# Patient Record
Sex: Female | Born: 1990 | Race: Black or African American | Hispanic: No | Smoking: Former smoker
Health system: Southern US, Community
[De-identification: ages and names within clinical notes are randomized; demographics above are authoritative.]

## PROBLEM LIST (undated history)

## (undated) ENCOUNTER — Emergency Department (HOSPITAL_COMMUNITY): Admission: EM | Payer: Self-pay | Source: Home / Self Care

## (undated) DIAGNOSIS — Z9109 Other allergy status, other than to drugs and biological substances: Secondary | ICD-10-CM

## (undated) DIAGNOSIS — A749 Chlamydial infection, unspecified: Secondary | ICD-10-CM

## (undated) DIAGNOSIS — Z789 Other specified health status: Secondary | ICD-10-CM

## (undated) HISTORY — PX: OTHER SURGICAL HISTORY: SHX169

## (undated) HISTORY — DX: Other specified health status: Z78.9

---
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
## 2013-03-22 ENCOUNTER — Encounter: Payer: Self-pay | Admitting: *Deleted

## 2013-03-23 ENCOUNTER — Ambulatory Visit: Payer: Self-pay

## 2013-03-23 ENCOUNTER — Ambulatory Visit (INDEPENDENT_AMBULATORY_CARE_PROVIDER_SITE_OTHER): Payer: Medicaid Other | Admitting: Obstetrics & Gynecology

## 2013-03-23 ENCOUNTER — Encounter: Payer: Self-pay | Admitting: Obstetrics & Gynecology

## 2013-03-23 VITALS — BP 120/70 | Ht 66.0 in | Wt 228.0 lb

## 2013-03-23 DIAGNOSIS — Z3202 Encounter for pregnancy test, result negative: Secondary | ICD-10-CM

## 2013-03-23 DIAGNOSIS — Z3049 Encounter for surveillance of other contraceptives: Secondary | ICD-10-CM

## 2013-03-23 DIAGNOSIS — Z309 Encounter for contraceptive management, unspecified: Secondary | ICD-10-CM

## 2013-03-23 DIAGNOSIS — Z3200 Encounter for pregnancy test, result unknown: Secondary | ICD-10-CM

## 2013-03-23 LAB — POCT URINE PREGNANCY: Preg Test, Ur: NEGATIVE

## 2013-03-23 MED ORDER — MEDROXYPROGESTERONE ACETATE 150 MG/ML IM SUSP
150.0000 mg | Freq: Once | INTRAMUSCULAR | Status: AC
  Administered 2013-03-23: 150 mg via INTRAMUSCULAR

## 2013-03-26 ENCOUNTER — Ambulatory Visit: Payer: Self-pay

## 2013-06-15 ENCOUNTER — Encounter: Payer: Self-pay | Admitting: Adult Health

## 2013-06-15 ENCOUNTER — Ambulatory Visit (INDEPENDENT_AMBULATORY_CARE_PROVIDER_SITE_OTHER): Payer: Medicaid Other | Admitting: Adult Health

## 2013-06-15 VITALS — BP 120/78 | Ht 66.0 in | Wt 231.0 lb

## 2013-06-15 DIAGNOSIS — Z3202 Encounter for pregnancy test, result negative: Secondary | ICD-10-CM

## 2013-06-15 DIAGNOSIS — Z3049 Encounter for surveillance of other contraceptives: Secondary | ICD-10-CM

## 2013-06-15 LAB — POCT URINE PREGNANCY: Preg Test, Ur: NEGATIVE

## 2013-06-15 MED ORDER — MEDROXYPROGESTERONE ACETATE 150 MG/ML IM SUSP
150.0000 mg | Freq: Once | INTRAMUSCULAR | Status: AC
  Administered 2013-06-15: 150 mg via INTRAMUSCULAR

## 2013-06-15 NOTE — Progress Notes (Signed)
Pt here for Depo Provera injection. No problems at this time. Pt to return in 12 weeks for next injection.

## 2013-09-07 ENCOUNTER — Ambulatory Visit (INDEPENDENT_AMBULATORY_CARE_PROVIDER_SITE_OTHER): Payer: Medicaid Other | Admitting: Obstetrics & Gynecology

## 2013-09-07 ENCOUNTER — Telehealth: Payer: Self-pay | Admitting: *Deleted

## 2013-09-07 ENCOUNTER — Encounter (INDEPENDENT_AMBULATORY_CARE_PROVIDER_SITE_OTHER): Payer: Self-pay

## 2013-09-07 ENCOUNTER — Encounter: Payer: Self-pay | Admitting: Obstetrics & Gynecology

## 2013-09-07 VITALS — BP 120/80 | Ht 66.0 in | Wt 226.0 lb

## 2013-09-07 DIAGNOSIS — Z3049 Encounter for surveillance of other contraceptives: Secondary | ICD-10-CM

## 2013-09-07 DIAGNOSIS — Z3202 Encounter for pregnancy test, result negative: Secondary | ICD-10-CM

## 2013-09-07 DIAGNOSIS — Z1389 Encounter for screening for other disorder: Secondary | ICD-10-CM

## 2013-09-07 DIAGNOSIS — R300 Dysuria: Secondary | ICD-10-CM

## 2013-09-07 DIAGNOSIS — Z309 Encounter for contraceptive management, unspecified: Secondary | ICD-10-CM

## 2013-09-07 DIAGNOSIS — Z3200 Encounter for pregnancy test, result unknown: Secondary | ICD-10-CM

## 2013-09-07 LAB — POCT URINALYSIS DIPSTICK
Blood, UA: 3
Ketones, UA: NEGATIVE

## 2013-09-07 LAB — POCT URINE PREGNANCY: Preg Test, Ur: NEGATIVE

## 2013-09-07 MED ORDER — NITROFURANTOIN MONOHYD MACRO 100 MG PO CAPS: 100.0000 mg | ORAL_CAPSULE | Freq: Two times a day (BID) | ORAL | Status: DC

## 2013-09-07 MED ORDER — MEDROXYPROGESTERONE ACETATE 150 MG/ML IM SUSP
150.0000 mg | Freq: Once | INTRAMUSCULAR | Status: AC
  Administered 2013-09-07: 150 mg via INTRAMUSCULAR

## 2013-09-07 NOTE — Addendum Note (Signed)
Addended by: Richardson Chiquito on: 09/07/2013 09:40 AM   Modules accepted: Orders

## 2013-09-07 NOTE — Progress Notes (Signed)
Patient ID: Tammy [name], female   DOB: [birth year], 22 y.o.   MRN: [id number] Pt here today for DEPO injection, Pt stated that she was having really bad burning with urination and pain and pressure after voiding. Pt stated that the burning started the day before yesterday. Pt gave urine specimen  and it was sent to lab. Will route message to Dr. Despina Hidden as well.

## 2013-09-07 NOTE — Telephone Encounter (Signed)
Pt came in for DEPO injection today and stated that she has been burning really bad with urination for the past two days. Pt also stated that she was having some pain and pressure after voiding. Pt gave a specimen and it was sent to lab for a UA C&S.

## 2013-09-07 NOTE — Addendum Note (Signed)
Addended by: Richardson Chiquito on: 09/07/2013 09:43 AM   Modules accepted: Orders

## 2013-09-08 LAB — URINALYSIS
Glucose, UA: NEGATIVE mg/dL
Nitrite: POSITIVE — AB
Protein, ur: NEGATIVE mg/dL
Urobilinogen, UA: 0.2 mg/dL (ref 0.0–1.0)

## 2013-09-10 LAB — URINE CULTURE

## 2013-11-02 ENCOUNTER — Encounter (HOSPITAL_COMMUNITY): Payer: Self-pay | Admitting: Emergency Medicine

## 2013-11-02 ENCOUNTER — Emergency Department (HOSPITAL_COMMUNITY)
Admission: EM | Admit: 2013-11-02 | Discharge: 2013-11-02 | Disposition: A | Discharge status: Home / Self Care | Payer: Medicaid Other | Attending: Emergency Medicine | Admitting: Emergency Medicine

## 2013-11-02 ENCOUNTER — Emergency Department (HOSPITAL_COMMUNITY): Payer: Medicaid Other

## 2013-11-02 DIAGNOSIS — Z880 Allergy status to penicillin: Secondary | ICD-10-CM | POA: Insufficient documentation

## 2013-11-02 DIAGNOSIS — Z79899 Other long term (current) drug therapy: Secondary | ICD-10-CM | POA: Insufficient documentation

## 2013-11-02 DIAGNOSIS — F17200 Nicotine dependence, unspecified, uncomplicated: Secondary | ICD-10-CM | POA: Insufficient documentation

## 2013-11-02 DIAGNOSIS — Z9109 Other allergy status, other than to drugs and biological substances: Secondary | ICD-10-CM | POA: Insufficient documentation

## 2013-11-02 DIAGNOSIS — M722 Plantar fascial fibromatosis: Secondary | ICD-10-CM

## 2013-11-02 DIAGNOSIS — R000 Tachycardia, unspecified: Secondary | ICD-10-CM | POA: Insufficient documentation

## 2013-11-02 HISTORY — DX: Other allergy status, other than to drugs and biological substances: Z91.09

## 2013-11-02 MED ORDER — HYDROCODONE-ACETAMINOPHEN 7.5-325 MG PO TABS: 1.0000 | ORAL_TABLET | ORAL | Status: DC | PRN

## 2013-11-02 MED ORDER — DEXAMETHASONE 6 MG PO TABS: ORAL_TABLET | ORAL | Status: DC

## 2013-11-02 MED ORDER — KETOROLAC TROMETHAMINE 10 MG PO TABS
10.0000 mg | ORAL_TABLET | Freq: Once | ORAL | Status: AC
  Administered 2013-11-02: 10 mg via ORAL
  Filled 2013-11-02: qty 1

## 2013-11-02 MED ORDER — DICLOFENAC SODIUM 75 MG PO TBEC: 75.0000 mg | DELAYED_RELEASE_TABLET | Freq: Two times a day (BID) | ORAL | Status: DC

## 2013-11-02 MED ORDER — PREDNISONE 50 MG PO TABS
60.0000 mg | ORAL_TABLET | Freq: Once | ORAL | Status: AC
  Administered 2013-11-02: 17:00:00 60 mg via ORAL
  Filled 2013-11-02 (×2): qty 1

## 2013-11-02 NOTE — ED Notes (Signed)
Pain rt foot for 1 week  "sharp"   No known injury

## 2013-11-02 NOTE — ED Provider Notes (Signed)
History/physical exam/procedure(s) were performed by non-physician practitioner and as supervising physician I was immediately available for consultation/collaboration. I have reviewed all notes and am in agreement with care and plan.   Georgean Spainhower S Zade Falkner, MD 11/02/13 2315 

## 2013-11-02 NOTE — ED Provider Notes (Signed)
CSN: [id number]     Arrival date & time 11/02/13  1614 History   First MD Initiated Contact with Patient 11/02/13 1637     Chief Complaint  Patient presents with  . Foot Pain   (Consider location/radiation/quality/duration/timing/severity/associated sxs/prior Treatment) HPI Comments: Pt has a job of standing 8 to 12 hours daily. She developed a callus about 1 month ago on the right foot, had it shaved down, but now has pain of the bottom of the foot that states in the early AM upon awaking and standing. Today the pain became nearly unbearable.  Patient is a 22 y.o. female presenting with lower extremity pain. The history is provided by the patient.  Foot Pain This is a new problem. The current episode started in the past 7 days. Pertinent negatives include no abdominal pain, arthralgias, chest pain, coughing or neck pain.    Past Medical History  Diagnosis Date  . Medical history non-contributory   . Environmental allergies    Past Surgical History  Procedure Laterality Date  . Wisdom teeth abstraction     Family History  Problem Relation Age of Onset  . Hypertension Mother   . Diabetes Maternal Aunt   . Cancer Maternal Uncle   . Cancer Maternal Grandmother    History  Substance Use Topics  . Smoking status: Light Tobacco Smoker    Types: Cigarettes  . Smokeless tobacco: Never Used  . Alcohol Use: No   OB History   Grav Para Term Preterm Abortions TAB SAB Ect Mult Living                 Review of Systems  Constitutional: Negative for activity change.       All ROS Neg except as noted in HPI  HENT: Negative for nosebleeds.   Eyes: Negative for photophobia and discharge.  Respiratory: Negative for cough, shortness of breath and wheezing.   Cardiovascular: Negative for chest pain and palpitations.  Gastrointestinal: Negative for abdominal pain and blood in stool.  Genitourinary: Negative for dysuria, frequency and hematuria.  Musculoskeletal: Negative for  arthralgias, back pain and neck pain.       Foot pain  Skin: Negative.   Neurological: Negative for dizziness, seizures and speech difficulty.  Psychiatric/Behavioral: Negative for hallucinations and confusion.    Allergies  Other and Penicillins  Home Medications   Current Outpatient Rx  Name  Route  Sig  Dispense  Refill  . cetirizine (ZYRTEC) 10 MG tablet   Oral   Take 10 mg by mouth daily.         . medroxyPROGESTERone (DEPO-PROVERA) 150 MG/ML injection   Intramuscular   Inject 150 mg into the muscle every 3 (three) months.         . nitrofurantoin, macrocrystal-monohydrate, (MACROBID) 100 MG capsule   Oral   Take 1 capsule (100 mg total) by mouth 2 (two) times daily.   14 capsule   0    BP 142/78  Pulse 104  Temp(Src) 99.9 F (37.7 C) (Oral)  Resp 20  Ht 5\' 5"  (1.651 m)  Wt 220 lb (99.791 kg)  BMI 36.61 kg/m2  SpO2 100% Physical Exam  Nursing note and vitals reviewed. Constitutional: She is oriented to person, place, and time. She appears well-developed and well-nourished.  Non-toxic appearance.  HENT:  Head: Normocephalic.  Right Ear: Tympanic membrane and external ear normal.  Left Ear: Tympanic membrane and external ear normal.  Eyes: EOM and lids are normal. Pupils are equal, round, and  reactive to light.  Neck: Normal range of motion. Neck supple. Carotid bruit is not present.  Cardiovascular: Regular rhythm, normal heart sounds, intact distal pulses and normal pulses.  Tachycardia present.   Pulmonary/Chest: Breath sounds normal. No respiratory distress.  Abdominal: Soft. Bowel sounds are normal. There is no tenderness. There is no guarding.  Musculoskeletal: Normal range of motion.  Pain with palpation of the plantar surface of the right foot, form the calcaneal area to the metatarsal heads. Callus of the 2nd and 3rd MT head, tender to palpation. DP 2+. Cap refill less than 2 sec. Marland Kitchen No lesion between the toes. Achilles intact.  Lymphadenopathy:        Head (right side): No submandibular adenopathy present.       Head (left side): No submandibular adenopathy present.    She has no cervical adenopathy.  Neurological: She is alert and oriented to person, place, and time. She has normal strength. No cranial nerve deficit or sensory deficit.  Skin: Skin is warm and dry.  Psychiatric: She has a normal mood and affect. Her speech is normal.    ED Course  Procedures (including critical care time) Labs Review Labs Reviewed - No data to display Imaging Review No results found. Pulse ox 100% on room air. WNL by my interpretation. EKG Interpretation   None       MDM  No diagnosis found. **I have reviewed nursing notes, vital signs, and all appropriate lab and imaging results for this patient.*  Xray of the right foot for fx or spur or other problems. Pt referred to Dr Nolen Mu - Pod. Med.  Rx for decadron, Norco, and diclofenac given to the patient.  Kathie Dike, PA-C 11/02/13 1735

## 2013-11-30 ENCOUNTER — Ambulatory Visit (INDEPENDENT_AMBULATORY_CARE_PROVIDER_SITE_OTHER): Payer: Medicaid Other | Admitting: Adult Health

## 2013-11-30 ENCOUNTER — Encounter: Payer: Self-pay | Admitting: Adult Health

## 2013-11-30 VITALS — BP 120/68 | Ht 66.0 in | Wt 227.0 lb

## 2013-11-30 DIAGNOSIS — Z3049 Encounter for surveillance of other contraceptives: Secondary | ICD-10-CM

## 2013-11-30 DIAGNOSIS — Z309 Encounter for contraceptive management, unspecified: Secondary | ICD-10-CM

## 2013-11-30 DIAGNOSIS — Z3202 Encounter for pregnancy test, result negative: Secondary | ICD-10-CM

## 2013-11-30 LAB — POCT URINE PREGNANCY: Preg Test, Ur: NEGATIVE

## 2013-11-30 MED ORDER — MEDROXYPROGESTERONE ACETATE 150 MG/ML IM SUSP
150.0000 mg | Freq: Once | INTRAMUSCULAR | Status: AC
  Administered 2013-11-30: 150 mg via INTRAMUSCULAR

## 2014-02-18 ENCOUNTER — Other Ambulatory Visit: Payer: Self-pay | Admitting: Adult Health

## 2014-02-21 ENCOUNTER — Ambulatory Visit (INDEPENDENT_AMBULATORY_CARE_PROVIDER_SITE_OTHER): Payer: Medicaid Other | Admitting: Obstetrics and Gynecology

## 2014-02-21 ENCOUNTER — Encounter: Payer: Self-pay | Admitting: Obstetrics and Gynecology

## 2014-02-21 VITALS — BP 130/60 | Ht 65.0 in | Wt 223.8 lb

## 2014-02-21 DIAGNOSIS — Z3049 Encounter for surveillance of other contraceptives: Secondary | ICD-10-CM

## 2014-02-21 DIAGNOSIS — Z309 Encounter for contraceptive management, unspecified: Secondary | ICD-10-CM

## 2014-02-21 DIAGNOSIS — Z3202 Encounter for pregnancy test, result negative: Secondary | ICD-10-CM

## 2014-02-21 LAB — POCT URINE PREGNANCY: Preg Test, Ur: NEGATIVE

## 2014-02-21 MED ORDER — MEDROXYPROGESTERONE ACETATE 150 MG/ML IM SUSP
150.0000 mg | Freq: Once | INTRAMUSCULAR | Status: AC
  Administered 2014-02-21: 150 mg via INTRAMUSCULAR

## 2014-02-21 NOTE — Progress Notes (Signed)
Patient ID: Tammy [name], female   DOB: Jan 13, 1991, 23 y.o.   MRN: [id number] Depo provera 150 mg IM injection given right deltoid, with no complaints. Resulted negative pregnancy test. Pt to return in 12 weeks for next injection.

## 2014-05-15 ENCOUNTER — Ambulatory Visit: Payer: Medicaid Other

## 2014-05-15 ENCOUNTER — Other Ambulatory Visit: Payer: Self-pay | Admitting: Adult Health

## 2014-05-16 ENCOUNTER — Ambulatory Visit (INDEPENDENT_AMBULATORY_CARE_PROVIDER_SITE_OTHER): Payer: Medicaid Other | Admitting: Adult Health

## 2014-05-16 ENCOUNTER — Encounter: Payer: Self-pay | Admitting: Adult Health

## 2014-05-16 ENCOUNTER — Ambulatory Visit: Payer: Medicaid Other

## 2014-05-16 DIAGNOSIS — Z3042 Encounter for surveillance of injectable contraceptive: Secondary | ICD-10-CM

## 2014-05-16 DIAGNOSIS — Z3049 Encounter for surveillance of other contraceptives: Secondary | ICD-10-CM

## 2014-05-16 DIAGNOSIS — Z3202 Encounter for pregnancy test, result negative: Secondary | ICD-10-CM

## 2014-05-16 LAB — POCT URINE PREGNANCY: PREG TEST UR: NEGATIVE

## 2014-05-16 MED ORDER — MEDROXYPROGESTERONE ACETATE 150 MG/ML IM SUSP
150.0000 mg | Freq: Once | INTRAMUSCULAR | Status: AC
  Administered 2014-05-16: 150 mg via INTRAMUSCULAR

## 2014-06-03 ENCOUNTER — Other Ambulatory Visit: Payer: Medicaid Other | Admitting: Obstetrics and Gynecology

## 2014-06-05 ENCOUNTER — Other Ambulatory Visit: Payer: Medicaid Other | Admitting: Obstetrics and Gynecology

## 2014-07-28 ENCOUNTER — Encounter (HOSPITAL_COMMUNITY): Payer: Self-pay | Admitting: Emergency Medicine

## 2014-07-28 ENCOUNTER — Emergency Department (HOSPITAL_COMMUNITY)
Admission: EM | Admit: 2014-07-28 | Discharge: 2014-07-28 | Disposition: A | Discharge status: Home / Self Care | Payer: Medicaid Other | Attending: Emergency Medicine | Admitting: Emergency Medicine

## 2014-07-28 DIAGNOSIS — J029 Acute pharyngitis, unspecified: Secondary | ICD-10-CM

## 2014-07-28 DIAGNOSIS — F17200 Nicotine dependence, unspecified, uncomplicated: Secondary | ICD-10-CM | POA: Insufficient documentation

## 2014-07-28 DIAGNOSIS — R110 Nausea: Secondary | ICD-10-CM | POA: Insufficient documentation

## 2014-07-28 DIAGNOSIS — Z79899 Other long term (current) drug therapy: Secondary | ICD-10-CM | POA: Insufficient documentation

## 2014-07-28 DIAGNOSIS — R000 Tachycardia, unspecified: Secondary | ICD-10-CM | POA: Insufficient documentation

## 2014-07-28 DIAGNOSIS — Z880 Allergy status to penicillin: Secondary | ICD-10-CM | POA: Insufficient documentation

## 2014-07-28 MED ORDER — IBUPROFEN 800 MG PO TABS
800.0000 mg | ORAL_TABLET | Freq: Once | ORAL | Status: AC
  Administered 2014-07-28: 800 mg via ORAL
  Filled 2014-07-28: qty 1

## 2014-07-28 MED ORDER — CLARITHROMYCIN 500 MG PO TABS
500.0000 mg | ORAL_TABLET | Freq: Once | ORAL | Status: AC
  Administered 2014-07-28: 500 mg via ORAL
  Filled 2014-07-28: qty 1

## 2014-07-28 MED ORDER — IBUPROFEN 800 MG PO TABS: 800.0000 mg | ORAL_TABLET | Freq: Three times a day (TID) | ORAL | Status: DC

## 2014-07-28 MED ORDER — CLARITHROMYCIN 500 MG PO TABS: 500.0000 mg | ORAL_TABLET | Freq: Two times a day (BID) | ORAL | Status: DC

## 2014-07-28 NOTE — ED Provider Notes (Signed)
CSN: [id number]     Arrival date & time 07/28/14  2021 History   First MD Initiated Contact with Patient 07/28/14 2154     Chief Complaint  Patient presents with  . Sore Throat     (Consider location/radiation/quality/duration/timing/severity/associated sxs/prior Treatment) Patient is a 23 y.o. female presenting with pharyngitis. The history is provided by the patient.  Sore Throat This is a new problem. The current episode started in the past 7 days. The problem occurs intermittently. The problem has been gradually worsening. Associated symptoms include chills, headaches, myalgias, nausea and a sore throat. Pertinent negatives include no abdominal pain, arthralgias, chest pain, coughing, neck pain or rash. The symptoms are aggravated by swallowing. She has tried nothing for the symptoms. The treatment provided no relief.    Past Medical History  Diagnosis Date  . Medical history non-contributory   . Environmental allergies    Past Surgical History  Procedure Laterality Date  . Wisdom teeth abstraction     Family History  Problem Relation Age of Onset  . Hypertension Mother   . Diabetes Maternal Aunt   . Cancer Maternal Uncle   . Cancer Maternal Grandmother    History  Substance Use Topics  . Smoking status: Light Tobacco Smoker    Types: Cigarettes  . Smokeless tobacco: Never Used  . Alcohol Use: No   OB History   Grav Para Term Preterm Abortions TAB SAB Ect Mult Living                 Review of Systems  Constitutional: Positive for chills. Negative for activity change.       All ROS Neg except as noted in HPI  HENT: Positive for sore throat.   Eyes: Negative for photophobia and discharge.  Respiratory: Negative for cough, shortness of breath and wheezing.   Cardiovascular: Negative for chest pain and palpitations.  Gastrointestinal: Positive for nausea. Negative for abdominal pain and blood in stool.  Genitourinary: Negative for dysuria, frequency and hematuria.   Musculoskeletal: Positive for myalgias. Negative for arthralgias, back pain and neck pain.  Skin: Negative.  Negative for rash.  Neurological: Positive for headaches. Negative for dizziness, seizures and speech difficulty.  Psychiatric/Behavioral: Negative for hallucinations and confusion.      Allergies  Other and Penicillins  Home Medications   Prior to Admission medications   Medication Sig Start Date End Date Taking? Authorizing Provider  cetirizine (ZYRTEC) 10 MG tablet Take 10 mg by mouth daily.    Historical Provider, MD  medroxyPROGESTERone (DEPO-PROVERA) 150 MG/ML injection INJECT 1 VIAL EVERY 3 MONTHS (IN OFFICE) 05/15/14   Adline Potter, NP   BP 132/86  Pulse 102  Temp(Src) 99.3 F (37.4 C) (Oral)  Resp 20  Ht  (1.651 m)  Wt 230 lb (104.327 kg)  BMI 38.27 kg/m2  SpO2 98% Physical Exam  Nursing note and vitals reviewed. Constitutional: She is oriented to person, place, and time. She appears well-developed and well-nourished.  Non-toxic appearance.  HENT:  Head: Normocephalic.  Right Ear: Tympanic membrane and external ear normal.  Left Ear: Tympanic membrane and external ear normal.  Mouth/Throat: Uvula is midline. Uvula swelling present. Posterior oropharyngeal erythema present. No tonsillar abscesses.  Mild nasal congestion present.  Eyes: EOM and lids are normal. Pupils are equal, round, and reactive to light.  Neck: Normal range of motion. Neck supple. Carotid bruit is not present.  Cardiovascular: Regular rhythm, normal heart sounds, intact distal pulses and normal pulses.  Tachycardia present.  Pulmonary/Chest: Breath sounds normal. No respiratory distress.  Abdominal: Soft. Bowel sounds are normal. There is no tenderness. There is no guarding.  Musculoskeletal: Normal range of motion.  Lymphadenopathy:       Head (right side): No submandibular adenopathy present.       Head (left side): No submandibular adenopathy present.    She has no  cervical adenopathy.  Neurological: She is alert and oriented to person, place, and time. She has normal strength. No cranial nerve deficit or sensory deficit.  Skin: Skin is warm and dry.  Psychiatric: She has a normal mood and affect. Her speech is normal.    ED Course  Procedures (including critical care time) Labs Review Labs Reviewed - No data to display  Imaging Review No results found.   EKG Interpretation None      MDM  Temperature 99.3. Pulse rate slightly elevated at 102. The pulse oximetry is 98% on room air. Within normal limits by my interpretation. Patient eating crackers and drinking soda in the emergency department without problems.  No abscess appreciated. Patient is able to mobilize her secretions without problem. Speech is clear and understandable. Suspect pharyngitis. Patient is treated with Biaxin 2 times daily and ibuprofen 3 times daily. Patient will use saltwater gargles. I've instructed the patient the importance of using her mask, as well as washing hands frequently.    Final diagnoses:  None    *I have reviewed nursing notes, vital signs, and all appropriate lab and imaging results for this patient.Kathie Dike, PA-C 07/28/14 2214

## 2014-07-28 NOTE — ED Notes (Signed)
Pt c/o sore throat x2 days 

## 2014-07-28 NOTE — Discharge Instructions (Signed)
Please wash hands frequently. Please use salt water gargles 2 or 3 times daily. Please use Biaxin 2 times daily with food, ibuprofen 3 times daily with food until all taken. Please use the mask until this infection is resolved. Pharyngitis Pharyngitis is a sore throat (pharynx). There is redness, pain, and swelling of your throat. HOME CARE   Drink enough fluids to keep your pee (urine) clear or pale yellow.  Only take medicine as told by your doctor.  You may get sick again if you do not take medicine as told. Finish your medicines, even if you start to feel better.  Do not take aspirin.  Rest.  Rinse your mouth (gargle) with salt water ( tsp of salt per 1 qt of water) every 1-2 hours. This will help the pain.  If you are not at risk for choking, you can suck on hard candy or sore throat lozenges. GET HELP IF:  You have large, tender lumps on your neck.  You have a rash.  You cough up green, yellow-brown, or bloody spit. GET HELP RIGHT AWAY IF:   You have a stiff neck.  You drool or cannot swallow liquids.  You throw up (vomit) or are not able to keep medicine or liquids down.  You have very bad pain that does not go away with medicine.  You have problems breathing (not from a stuffy nose). MAKE SURE YOU:   Understand these instructions.  Will watch your condition.  Will get help right away if you are not doing well or get worse. Document Released: 04/26/2008 Document Revised: 08/29/2013 Document Reviewed: 07/16/2013 [facility] Patient Information 2015 [place], [place]. This information is not intended to replace advice given to you by your health care provider. Make sure you discuss any questions you have with your health care provider.

## 2014-07-29 NOTE — ED Provider Notes (Signed)
Medical screening examination/treatment/procedure(s) were performed by non-physician practitioner and as supervising physician I was immediately available for consultation/collaboration.   EKG Interpretation None       Hurman Horn, MD 07/29/14 2135

## 2014-08-09 ENCOUNTER — Ambulatory Visit: Payer: Medicaid Other

## 2014-08-23 ENCOUNTER — Emergency Department (HOSPITAL_COMMUNITY)
Admission: EM | Admit: 2014-08-23 | Discharge: 2014-08-23 | Disposition: A | Discharge status: Home / Self Care | Payer: Medicaid Other | Attending: Emergency Medicine | Admitting: Emergency Medicine

## 2014-08-23 ENCOUNTER — Encounter (HOSPITAL_COMMUNITY): Payer: Self-pay | Admitting: Emergency Medicine

## 2014-08-23 DIAGNOSIS — N760 Acute vaginitis: Secondary | ICD-10-CM | POA: Insufficient documentation

## 2014-08-23 DIAGNOSIS — Z880 Allergy status to penicillin: Secondary | ICD-10-CM | POA: Insufficient documentation

## 2014-08-23 DIAGNOSIS — N390 Urinary tract infection, site not specified: Secondary | ICD-10-CM | POA: Insufficient documentation

## 2014-08-23 DIAGNOSIS — Z202 Contact with and (suspected) exposure to infections with a predominantly sexual mode of transmission: Secondary | ICD-10-CM | POA: Insufficient documentation

## 2014-08-23 DIAGNOSIS — Z3202 Encounter for pregnancy test, result negative: Secondary | ICD-10-CM | POA: Insufficient documentation

## 2014-08-23 DIAGNOSIS — B9689 Other specified bacterial agents as the cause of diseases classified elsewhere: Secondary | ICD-10-CM

## 2014-08-23 DIAGNOSIS — Z720 Tobacco use: Secondary | ICD-10-CM | POA: Insufficient documentation

## 2014-08-23 LAB — URINE MICROSCOPIC-ADD ON

## 2014-08-23 LAB — URINALYSIS, ROUTINE W REFLEX MICROSCOPIC
Glucose, UA: NEGATIVE mg/dL
KETONES UR: NEGATIVE mg/dL
NITRITE: NEGATIVE
Protein, ur: NEGATIVE mg/dL
SPECIFIC GRAVITY, URINE: 1.02 (ref 1.005–1.030)
Urobilinogen, UA: 0.2 mg/dL (ref 0.0–1.0)
pH: 6 (ref 5.0–8.0)

## 2014-08-23 LAB — WET PREP, GENITAL
TRICH WET PREP: NONE SEEN
YEAST WET PREP: NONE SEEN

## 2014-08-23 LAB — PREGNANCY, URINE: PREG TEST UR: NEGATIVE

## 2014-08-23 LAB — RPR

## 2014-08-23 LAB — HIV ANTIBODY (ROUTINE TESTING W REFLEX): HIV 1&2 Ab, 4th Generation: NONREACTIVE

## 2014-08-23 MED ORDER — CLARITHROMYCIN 500 MG PO TABS: 500.0000 mg | ORAL_TABLET | Freq: Two times a day (BID) | ORAL | Status: DC

## 2014-08-23 MED ORDER — AZITHROMYCIN 250 MG PO TABS
1000.0000 mg | ORAL_TABLET | Freq: Once | ORAL | Status: AC
  Administered 2014-08-23: 1000 mg via ORAL
  Filled 2014-08-23: qty 4

## 2014-08-23 MED ORDER — METRONIDAZOLE 500 MG PO TABS: 500.0000 mg | ORAL_TABLET | Freq: Two times a day (BID) | ORAL | Status: DC

## 2014-08-23 MED ORDER — AZITHROMYCIN 250 MG PO TABS: 1000.0000 mg | ORAL_TABLET | Freq: Once | ORAL | Status: DC

## 2014-08-23 MED ORDER — ONDANSETRON HCL 4 MG PO TABS
4.0000 mg | ORAL_TABLET | Freq: Once | ORAL | Status: AC
  Administered 2014-08-23: 4 mg via ORAL
  Filled 2014-08-23: qty 1

## 2014-08-23 NOTE — ED Notes (Addendum)
[facility]. States she was called and told she has clamydia and to come back for treatment. States she has to go to work today so she just came here

## 2014-08-23 NOTE — Discharge Instructions (Signed)
Your urine test reveals a urinary tract infection. Please increase fluids. Please use Biaxin 1 tablet 2 times daily with food until all taken. Your wet prep reveals bacterial vaginosis, please use Flagyl 2 times daily with food until all taken. Please do not use any form of alcohol products while taking Flagyl. Please refrain from sexual activity for the next 7 days. You were treated with Zithromax here in the emergency department. Someone from the flow managers office will call if any problems from your cultures. Bacterial Vaginosis Bacterial vaginosis is a vaginal infection that occurs when the normal balance of bacteria in the vagina is disrupted. It results from an overgrowth of certain bacteria. This is the most common vaginal infection in women of childbearing age. Treatment is important to prevent complications, especially in pregnant women, as it can cause a premature delivery. CAUSES  Bacterial vaginosis is caused by an increase in harmful bacteria that are normally present in smaller amounts in the vagina. Several different kinds of bacteria can cause bacterial vaginosis. However, the reason that the condition develops is not fully understood. RISK FACTORS Certain activities or behaviors can put you at an increased risk of developing bacterial vaginosis, including:  Having a new sex partner or multiple sex partners.  Douching.  Using an intrauterine device (IUD) for contraception. Women do not get bacterial vaginosis from toilet seats, bedding, swimming pools, or contact with objects around them. SIGNS AND SYMPTOMS  Some women with bacterial vaginosis have no signs or symptoms. Common symptoms include:  Grey vaginal discharge.  A fishlike odor with discharge, especially after sexual intercourse.  Itching or burning of the vagina and vulva.  Burning or pain with urination. DIAGNOSIS  Your health care provider will take a medical history and examine the vagina for signs of bacterial  vaginosis. A sample of vaginal fluid may be taken. Your health care provider will look at this sample under a microscope to check for bacteria and abnormal cells. A vaginal pH test may also be done.  TREATMENT  Bacterial vaginosis may be treated with antibiotic medicines. These may be given in the form of a pill or a vaginal cream. A second round of antibiotics may be prescribed if the condition comes back after treatment.  HOME CARE INSTRUCTIONS   Only take over-the-counter or prescription medicines as directed by your health care provider.  If antibiotic medicine was prescribed, take it as directed. Make sure you finish it even if you start to feel better.  Do not have sex until treatment is completed.  Tell all sexual partners that you have a vaginal infection. They should see their health care provider and be treated if they have problems, such as a mild rash or itching.  Practice safe sex by using condoms and only having one sex partner. SEEK MEDICAL CARE IF:   Your symptoms are not improving after 3 days of treatment.  You have increased discharge or pain.  You have a fever. MAKE SURE YOU:   Understand these instructions.  Will watch your condition.  Will get help right away if you are not doing well or get worse. FOR MORE INFORMATION  Centers for Disease Control and Prevention, Division of STD Prevention: [employer] American Sexual Health Association (ASHA): www.ashastd.org  Document Released: 11/08/2005 Document Revised: 08/29/2013 Document Reviewed: 06/20/2013 [facility] Patient Information 2015 [place], [place]. This information is not intended to replace advice given to you by your health care provider. Make sure you discuss any questions you have with your health  care provider.  Urinary Tract Infection A urinary tract infection (UTI) can occur any place along the urinary tract. The tract includes the kidneys, ureters, bladder, and urethra. A type of germ called  bacteria often causes a UTI. UTIs are often helped with antibiotic medicine.  HOME CARE   If given, take antibiotics as told by your doctor. Finish them even if you start to feel better.  Drink enough fluids to keep your pee (urine) clear or pale yellow.  Avoid tea, drinks with caffeine, and bubbly (carbonated) drinks.  Pee often. Avoid holding your pee in for a long time.  Pee before and after having sex (intercourse).  Wipe from front to back after you poop (bowel movement) if you are a woman. Use each tissue only once. GET HELP RIGHT AWAY IF:   You have back pain.  You have lower belly (abdominal) pain.  You have chills.  You feel sick to your stomach (nauseous).  You throw up (vomit).  Your burning or discomfort with peeing does not go away.  You have a fever.  Your symptoms are not better in 3 days. MAKE SURE YOU:   Understand these instructions.  Will watch your condition.  Will get help right away if you are not doing well or get worse. Document Released: 04/26/2008 Document Revised: 08/02/2012 Document Reviewed: 06/08/2012 [facility] Patient Information 2015 [place], [place]. This information is not intended to replace advice given to you by your health care provider. Make sure you discuss any questions you have with your health care provider.

## 2014-08-23 NOTE — ED Provider Notes (Addendum)
CSN: [id number]     Arrival date & time 08/23/14  4098 History   First MD Initiated Contact with Patient 08/23/14 1024     No chief complaint on file.    (Consider location/radiation/quality/duration/timing/severity/associated sxs/prior Treatment) HPI Comments: Patient is a 23 year old female who presents to the emergency department with a complaint of" it hurts when I pee" and " they told me I had Chlamydia". Patient states she developed a discharge and was evaluated at the health department for sexually transmitted disease. She was told that she had Chlamydia, and to come back to the health department for treatment. The patient states she was supposed to go today, but she has to work, so she came to the emergency department in hopes he could be treated and get out to go work. The patient also states that there are times when she isn't urinating that she has burning and pain with urination. Patient denies high fevers. She patient denies nausea vomiting. She presents now for assistance with these problems.  The history is provided by the patient.    Past Medical History  Diagnosis Date  . Medical history non-contributory   . Environmental allergies    Past Surgical History  Procedure Laterality Date  . Wisdom teeth abstraction     Family History  Problem Relation Age of Onset  . Hypertension Mother   . Diabetes Maternal Aunt   . Cancer Maternal Uncle   . Cancer Maternal Grandmother    History  Substance Use Topics  . Smoking status: Light Tobacco Smoker    Types: Cigarettes  . Smokeless tobacco: Never Used  . Alcohol Use: No   OB History   Grav Para Term Preterm Abortions TAB SAB Ect Mult Living                 Review of Systems  Constitutional: Negative for fever and activity change.       All ROS Neg except as noted in HPI  HENT: Negative for nosebleeds.   Eyes: Negative for photophobia and discharge.  Respiratory: Negative for cough, shortness of breath and wheezing.    Cardiovascular: Negative for chest pain and palpitations.  Gastrointestinal: Negative for nausea, vomiting, abdominal pain and blood in stool.  Genitourinary: Positive for dysuria and vaginal discharge. Negative for frequency and hematuria.  Musculoskeletal: Negative for arthralgias, back pain and neck pain.  Skin: Negative.   Neurological: Negative for dizziness, seizures and speech difficulty.  Psychiatric/Behavioral: Negative for hallucinations and confusion.      Allergies  Other; Peach flavor; and Penicillins  Home Medications   Prior to Admission medications   Not on File   BP 107/93  Pulse 109  Temp(Src) 98.3 F (36.8 C) (Oral)  Resp 18  Ht 5\' 5"  (1.651 m)  Wt 215 lb (97.523 kg)  BMI 35.78 kg/m2  SpO2 100% Physical Exam  Nursing note and vitals reviewed. Constitutional: She is oriented to person, place, and time. She appears well-developed and well-nourished.  Non-toxic appearance.  HENT:  Head: Normocephalic.  Right Ear: Tympanic membrane and external ear normal.  Left Ear: Tympanic membrane and external ear normal.  Eyes: EOM and lids are normal. Pupils are equal, round, and reactive to light.  Neck: Normal range of motion. Neck supple. Carotid bruit is not present.  Cardiovascular: Normal rate, regular rhythm, normal heart sounds, intact distal pulses and normal pulses.   Pulmonary/Chest: Breath sounds normal. No respiratory distress.  Abdominal: Soft. Bowel sounds are normal. There is no tenderness. There  is no guarding. Hernia confirmed negative in the right inguinal area and confirmed negative in the left inguinal area.  Genitourinary: Uterus normal. There is no rash or tenderness on the right labia. There is no rash or tenderness on the left labia. Cervix exhibits friability. Right adnexum displays no mass and no tenderness. Left adnexum displays no mass and no tenderness. No erythema or bleeding around the vagina. No foreign body around the vagina. No signs of  injury around the vagina. Vaginal discharge found.  Chaperone present during exam.  Musculoskeletal: Normal range of motion.  Lymphadenopathy:       Head (right side): No submandibular adenopathy present.       Head (left side): No submandibular adenopathy present.    She has no cervical adenopathy.  Neurological: She is alert and oriented to person, place, and time. She has normal strength. No cranial nerve deficit or sensory deficit.  Skin: Skin is warm and dry.  Psychiatric: She has a normal mood and affect. Her speech is normal.    ED Course  Pt request to go to cafe to eat before pelvic exam and medications.  Procedures (including critical care time) Labs Review Labs Reviewed  URINALYSIS, ROUTINE W REFLEX MICROSCOPIC - Abnormal; Notable for the following:    Hgb urine dipstick MODERATE (*)    Bilirubin Urine SMALL (*)    Leukocytes, UA MODERATE (*)    All other components within normal limits  URINE MICROSCOPIC-ADD ON - Abnormal; Notable for the following:    Squamous Epithelial / LPF FEW (*)    Bacteria, UA FEW (*)    All other components within normal limits  GC/CHLAMYDIA PROBE AMP  URINE CULTURE  WET PREP, GENITAL  PREGNANCY, URINE  RPR  HIV ANTIBODY (ROUTINE TESTING)    Imaging Review No results found.   EKG Interpretation None      MDM Vital signs within normal limits. Urine pregnancy test is negative. Urine analysis shows moderate hemoglobin, moderate leukocyte esterase, too many to count red cells and white cells.  Wet prep shows moderate clue cells and white blood cells.   Patient states she has an allergic type reaction to penicillins. Case discussed with pharmacist. The patient will be treated with Zithromax 1 g. urine culture sent to the lab. Patient will be treated with Biaxin until culture returns.    Final diagnoses:  None    *I have reviewed nursing notes, vital signs, and all appropriate lab and imaging results for this  patient.    Kathie DikeHobson M Jakiah Bienaime, PA-C 08/24/14 1924  Kathie DikeHobson M Sadiya Durand, PA-C 09/03/14 [phone]

## 2014-08-24 LAB — GC/CHLAMYDIA PROBE AMP
CT PROBE, AMP APTIMA: NEGATIVE
GC PROBE AMP APTIMA: NEGATIVE

## 2014-08-25 LAB — URINE CULTURE
Colony Count: >=100000
SPECIAL REQUESTS: NORMAL

## 2014-08-26 NOTE — ED Provider Notes (Signed)
Medical screening examination/treatment/procedure(s) were performed by non-physician practitioner and as supervising physician I was immediately available for consultation/collaboration.   EKG Interpretation None       Doug SouSam Danylah Holden, MD 08/26/14 0000

## 2014-08-28 NOTE — ED Notes (Signed)
Called for triage, pt not in waiting room.  

## 2014-08-28 NOTE — ED Notes (Signed)
Patient not not waiting, called for triage

## 2014-09-04 NOTE — ED Provider Notes (Signed)
Medical screening examination/treatment/procedure(s) were performed by non-physician practitioner and as supervising physician I was immediately available for consultation/collaboration.   EKG Interpretation None       Zoa Dowty, MD 09/04/14 2358 

## 2017-09-16 ENCOUNTER — Encounter (HOSPITAL_COMMUNITY): Payer: Self-pay

## 2017-09-16 ENCOUNTER — Emergency Department (HOSPITAL_COMMUNITY)
Admission: EM | Admit: 2017-09-16 | Discharge: 2017-09-16 | Disposition: A | Discharge status: Home / Self Care | Payer: Self-pay | Attending: Emergency Medicine | Admitting: Emergency Medicine

## 2017-09-16 ENCOUNTER — Emergency Department (HOSPITAL_COMMUNITY): Payer: Self-pay

## 2017-09-16 DIAGNOSIS — F17210 Nicotine dependence, cigarettes, uncomplicated: Secondary | ICD-10-CM | POA: Insufficient documentation

## 2017-09-16 DIAGNOSIS — R112 Nausea with vomiting, unspecified: Secondary | ICD-10-CM | POA: Insufficient documentation

## 2017-09-16 DIAGNOSIS — R197 Diarrhea, unspecified: Secondary | ICD-10-CM | POA: Insufficient documentation

## 2017-09-16 LAB — CBC WITH DIFFERENTIAL/PLATELET
BASOS PCT: 0 %
Basophils Absolute: 0 10*3/uL (ref 0.0–0.1)
EOS PCT: 1 %
Eosinophils Absolute: 0.1 10*3/uL (ref 0.0–0.7)
HCT: 35.5 % — ABNORMAL LOW (ref 36.0–46.0)
Hemoglobin: 11.9 g/dL — ABNORMAL LOW (ref 12.0–15.0)
Lymphocytes Relative: 34 %
Lymphs Abs: 3 10*3/uL (ref 0.7–4.0)
MCH: 31.3 pg (ref 26.0–34.0)
MCHC: 33.5 g/dL (ref 30.0–36.0)
MCV: 93.4 fL (ref 78.0–100.0)
MONOS PCT: 7 %
Monocytes Absolute: 0.6 10*3/uL (ref 0.1–1.0)
Neutro Abs: 5.2 10*3/uL (ref 1.7–7.7)
Neutrophils Relative %: 58 %
Platelets: 340 10*3/uL (ref 150–400)
RBC: 3.8 MIL/uL — ABNORMAL LOW (ref 3.87–5.11)
RDW: 13.8 % (ref 11.5–15.5)
WBC: 8.9 10*3/uL (ref 4.0–10.5)

## 2017-09-16 LAB — URINALYSIS, ROUTINE W REFLEX MICROSCOPIC
Bilirubin Urine: NEGATIVE
GLUCOSE, UA: NEGATIVE mg/dL
KETONES UR: NEGATIVE mg/dL
Nitrite: NEGATIVE
PH: 5 (ref 5.0–8.0)
Protein, ur: 30 mg/dL — AB
Specific Gravity, Urine: 1.021 (ref 1.005–1.030)

## 2017-09-16 LAB — COMPREHENSIVE METABOLIC PANEL
ALBUMIN: 4 g/dL (ref 3.5–5.0)
ALK PHOS: 53 U/L (ref 38–126)
ALT: 11 U/L — AB (ref 14–54)
ANION GAP: 7 (ref 5–15)
AST: 12 U/L — ABNORMAL LOW (ref 15–41)
BILIRUBIN TOTAL: 0.9 mg/dL (ref 0.3–1.2)
BUN: 10 mg/dL (ref 6–20)
CO2: 23 mmol/L (ref 22–32)
Calcium: 9 mg/dL (ref 8.9–10.3)
Chloride: 108 mmol/L (ref 101–111)
Creatinine, Ser: 0.77 mg/dL (ref 0.44–1.00)
GFR calc Af Amer: >60 mL/min (ref >60)
GFR calc non Af Amer: >60 mL/min (ref >60)
Glucose, Bld: 108 mg/dL — ABNORMAL HIGH (ref 65–99)
Potassium: 4 mmol/L (ref 3.5–5.1)
SODIUM: 138 mmol/L (ref 135–145)
TOTAL PROTEIN: 7.2 g/dL (ref 6.5–8.1)

## 2017-09-16 LAB — PREGNANCY, URINE: PREG TEST UR: NEGATIVE

## 2017-09-16 LAB — LIPASE, BLOOD: Lipase: 27 U/L (ref 11–51)

## 2017-09-16 MED ORDER — ONDANSETRON 4 MG PO TBDP: 4.0000 mg | ORAL_TABLET | Freq: Three times a day (TID) | ORAL | 0 refills | Status: DC | PRN

## 2017-09-16 MED ORDER — DICYCLOMINE HCL 10 MG/ML IM SOLN
20.0000 mg | Freq: Once | INTRAMUSCULAR | Status: AC
Start: 2017-09-16 — End: 2017-09-16
  Administered 2017-09-16: 20 mg via INTRAMUSCULAR
  Filled 2017-09-16: qty 2

## 2017-09-16 MED ORDER — ONDANSETRON 8 MG PO TBDP
8.0000 mg | ORAL_TABLET | Freq: Once | ORAL | Status: AC
  Administered 2017-09-16: 8 mg via ORAL
  Filled 2017-09-16: qty 1

## 2017-09-16 NOTE — Discharge Instructions (Signed)
Take the prescription as directed.  Increase your fluid intake (ie:  Gatoraide) for the next few days.  Eat a bland diet and advance to your regular diet slowly as you can tolerate it.   Avoid full strength juices, as well as milk and milk products until your diarrhea has resolved.   Call your regular medical doctor today to schedule a follow up appointment next week.  Return to the Emergency Department immediately if not improving (or even worsening) despite taking the medicines as prescribed, any black or bloody stool or vomit, or for any other concerns.

## 2017-09-16 NOTE — ED Notes (Signed)
Patient is very concerned with wanting to go home. Patient is ambulatory in hallway and denies pain. She is tolerating fluid.

## 2017-09-16 NOTE — ED Notes (Signed)
Patient tolerating fluid. 

## 2017-09-16 NOTE — ED Provider Notes (Signed)
[facility] EMERGENCY DEPARTMENT Provider Note   CSN: [id number] Arrival date & time: 09/16/17  [id number]     History   Chief Complaint Chief Complaint  Patient presents with  . Emesis    HPI Tammy [name] is a 26 y.o. female.  HPI  Pt was seen at 0940. Per pt, c/o gradual onset and persistence of multiple intermittent episodes of N/V/D for the past 2 days.   Describes the stools as "watery." Has been associated with abd "cramping." Denies abd pain, no CP/SOB, no back pain, no fevers, no black or blood in stools or emesis.    Past Medical History:  Diagnosis Date  . Environmental allergies   . Medical history non-contributory     There are no active problems to display for this patient.   Past Surgical History:  Procedure Laterality Date  . wisdom teeth abstraction      OB History    No data available       Home Medications    Prior to Admission medications   Medication Sig Start Date End Date Taking? Authorizing Provider  clarithromycin (BIAXIN) 500 MG tablet Take 1 tablet (500 mg total) by mouth 2 (two) times daily. 08/23/14   Ivery QualeBryant, Hobson, PA-C  metroNIDAZOLE (FLAGYL) 500 MG tablet Take 1 tablet (500 mg total) by mouth 2 (two) times daily. 08/23/14   Ivery QualeBryant, Hobson, PA-C    Family History Family History  Problem Relation Age of Onset  . Hypertension Mother   . Diabetes Maternal Aunt   . Cancer Maternal Uncle   . Cancer Maternal Grandmother     Social History Social History  Substance Use Topics  . Smoking status: Light Tobacco Smoker    Types: Cigarettes  . Smokeless tobacco: Never Used  . Alcohol use No     Allergies   Other; Peach flavor; and Penicillins   Review of Systems Review of Systems ROS: Statement: All systems negative except as marked or noted in the HPI; Constitutional: Negative for fever and chills. ; ; Eyes: Negative for eye pain, redness and discharge. ; ; ENMT: Negative for ear pain, hoarseness, nasal congestion, sinus  pressure and sore throat. ; ; Cardiovascular: Negative for chest pain, palpitations, diaphoresis, dyspnea and peripheral edema. ; ; Respiratory: Negative for cough, wheezing and stridor. ; ; Gastrointestinal: +N/V/D. Negative for abdominal pain, blood in stool, hematemesis, jaundice and rectal bleeding. . ; ; Genitourinary: Negative for dysuria, flank pain and hematuria. ; ; Musculoskeletal: Negative for back pain and neck pain. Negative for swelling and trauma.; ; Skin: Negative for pruritus, rash, abrasions, blisters, bruising and skin lesion.; ; Neuro: Negative for headache, lightheadedness and neck stiffness. Negative for weakness, altered level of consciousness, altered mental status, extremity weakness, paresthesias, involuntary movement, seizure and syncope.       Physical Exam Updated Vital Signs BP 120/69 (BP Location: Left Arm)   Pulse 91   Temp 98.5 F (36.9 C) (Oral)   Resp 18   Ht 5\' 5"  (1.651 m)   Wt 108.9 kg (240 lb)   LMP  (LMP Unknown)   SpO2 100%   BMI 39.94 kg/m   Physical Exam 0945: Physical examination:  Nursing notes reviewed; Vital signs and O2 SAT reviewed;  Constitutional: Well developed, Well nourished, Well hydrated, In no acute distress; Head:  Normocephalic, atraumatic; Eyes: EOMI, PERRL, No scleral icterus; ENMT: Mouth and pharynx normal, Mucous membranes moist; Neck: Supple, Full range of motion, No lymphadenopathy; Cardiovascular: Regular rate and rhythm, No gallop;  Respiratory: Breath sounds clear & equal bilaterally, No wheezes.  Speaking full sentences with ease, Normal respiratory effort/excursion; Chest: Nontender, Movement normal; Abdomen: Soft, Nontender, Nondistended, Normal bowel sounds; Genitourinary: No CVA tenderness; Extremities: Pulses normal, No tenderness, No edema, No calf edema or asymmetry.; Neuro: AA&Ox3, Major CN grossly intact.  Speech clear. No gross focal motor or sensory deficits in extremities. Climbs on and off stretcher easily by  herself. Gait steady..; Skin: Color normal, Warm, Dry.   ED Treatments / Results  Labs (all labs ordered are listed, but only abnormal results are displayed)   EKG  EKG Interpretation None       Radiology   Procedures Procedures (including critical care time)  Medications Ordered in ED Medications  ondansetron (ZOFRAN-ODT) disintegrating tablet 8 mg (8 mg Oral Given 09/16/17 0952)  dicyclomine (BENTYL) injection 20 mg (20 mg Intramuscular Given 09/16/17 0951)     Initial Impression / Assessment and Plan / ED Course  I have reviewed the triage vital signs and the nursing notes.  Pertinent labs & imaging results that were available during my care of the patient were reviewed by me and considered in my medical decision making (see chart for details).  MDM Reviewed: previous chart, nursing note and vitals Reviewed previous: labs Interpretation: labs and x-ray   Results for orders placed or performed during the hospital encounter of 09/16/17  Comprehensive metabolic panel  Result Value Ref Range   Sodium 138 135 - 145 mmol/L   Potassium 4.0 3.5 - 5.1 mmol/L   Chloride 108 101 - 111 mmol/L   CO2 23 22 - 32 mmol/L   Glucose, Bld 108 (H) 65 - 99 mg/dL   BUN 10 6 - 20 mg/dL   Creatinine, Ser 1.61 0.44 - 1.00 mg/dL   Calcium 9.0 8.9 - 09.6 mg/dL   Total Protein 7.2 6.5 - 8.1 g/dL   Albumin 4.0 3.5 - 5.0 g/dL   AST 12 (L) 15 - 41 U/L   ALT 11 (L) 14 - 54 U/L   Alkaline Phosphatase 53 38 - 126 U/L   Total Bilirubin 0.9 0.3 - 1.2 mg/dL   GFR calc non Af Amer >60 >60 mL/min   GFR calc Af Amer >60 >60 mL/min   Anion gap 7 5 - 15  Lipase, blood  Result Value Ref Range   Lipase 27 11 - 51 U/L  CBC with Differential  Result Value Ref Range   WBC 8.9 4.0 - 10.5 K/uL   RBC 3.80 (L) 3.87 - 5.11 MIL/uL   Hemoglobin 11.9 (L) 12.0 - 15.0 g/dL   HCT 04.5 (L) 40.9 - 81.1 %   MCV 93.4 78.0 - 100.0 fL   MCH 31.3 26.0 - 34.0 pg   MCHC 33.5 30.0 - 36.0 g/dL   RDW 91.4 78.2 -  95.6 %   Platelets 340 150 - 400 K/uL   Neutrophils Relative % 58 %   Neutro Abs 5.2 1.7 - 7.7 K/uL   Lymphocytes Relative 34 %   Lymphs Abs 3.0 0.7 - 4.0 K/uL   Monocytes Relative 7 %   Monocytes Absolute 0.6 0.1 - 1.0 K/uL   Eosinophils Relative 1 %   Eosinophils Absolute 0.1 0.0 - 0.7 K/uL   Basophils Relative 0 %   Basophils Absolute 0.0 0.0 - 0.1 K/uL  Pregnancy, urine  Result Value Ref Range   Preg Test, Ur NEGATIVE NEGATIVE  Urinalysis, Routine w reflex microscopic  Result Value Ref Range   Color, Urine YELLOW  YELLOW   APPearance CLOUDY (A) CLEAR   Specific Gravity, Urine 1.021 1.005 - 1.030   pH 5.0 5.0 - 8.0   Glucose, UA NEGATIVE NEGATIVE mg/dL   Hgb urine dipstick MODERATE (A) NEGATIVE   Bilirubin Urine NEGATIVE NEGATIVE   Ketones, ur NEGATIVE NEGATIVE mg/dL   Protein, ur 30 (A) NEGATIVE mg/dL   Nitrite NEGATIVE NEGATIVE   Leukocytes, UA TRACE (A) NEGATIVE   RBC / HPF 0-5 0 - 5 RBC/hpf   WBC, UA 6-30 0 - 5 WBC/hpf   Bacteria, UA RARE (A) NONE SEEN   Squamous Epithelial / LPF TOO NUMEROUS TO COUNT (A) NONE SEEN   Mucus PRESENT    Dg Abd Acute W/chest Result Date: 09/16/2017 CLINICAL DATA:  Nausea and vomiting for 2 days EXAM: DG ABDOMEN ACUTE W/ 1V CHEST COMPARISON:  01/13/2010 FINDINGS: Cardiac shadow is within normal limits. The lungs are clear bilaterally. No acute bony abnormality is noted. Scattered large and small bowel gas is noted. Mild retained fecal material is noted within the colon without obstructive change. This likely represents a mild degree of constipation. No abnormal mass or abnormal calcifications are seen. No bony abnormality is noted. No free air is seen. IMPRESSION: Changes of mild constipation. Electronically Signed   By: Alcide Clever M.D.   On: 09/16/2017 11:14    1130:  Pt has tol PO well while in the ED without N/V.  No stooling while in the ED.  Abd remains benign, VSS. Feels better and wants to go home now. Tx symptomatically at this time.  Dx and testing d/w pt.  Questions answered.  Verb understanding, agreeable to d/c home with outpt f/u.    Final Clinical Impressions(s) / ED Diagnoses   Final diagnoses:  None    New Prescriptions New Prescriptions   No medications on file     Samuel Jester, DO 09/20/17 1610

## 2017-09-16 NOTE — ED Triage Notes (Signed)
Vomiting and diarrhea x 2 days

## 2018-03-20 LAB — GLUCOSE, POCT (MANUAL RESULT ENTRY): POC GLUCOSE: 113 mg/dL — AB (ref 70–99)

## 2018-09-05 ENCOUNTER — Emergency Department (HOSPITAL_COMMUNITY)
Admission: EM | Admit: 2018-09-05 | Discharge: 2018-09-06 | Disposition: A | Discharge status: Home / Self Care | Payer: Self-pay | Attending: Emergency Medicine | Admitting: Emergency Medicine

## 2018-09-05 ENCOUNTER — Other Ambulatory Visit: Payer: Self-pay

## 2018-09-05 ENCOUNTER — Encounter (HOSPITAL_COMMUNITY): Payer: Self-pay | Admitting: Emergency Medicine

## 2018-09-05 DIAGNOSIS — Z202 Contact with and (suspected) exposure to infections with a predominantly sexual mode of transmission: Secondary | ICD-10-CM | POA: Insufficient documentation

## 2018-09-05 DIAGNOSIS — Z79899 Other long term (current) drug therapy: Secondary | ICD-10-CM | POA: Insufficient documentation

## 2018-09-05 DIAGNOSIS — F17210 Nicotine dependence, cigarettes, uncomplicated: Secondary | ICD-10-CM | POA: Insufficient documentation

## 2018-09-05 DIAGNOSIS — B9689 Other specified bacterial agents as the cause of diseases classified elsewhere: Secondary | ICD-10-CM

## 2018-09-05 DIAGNOSIS — N760 Acute vaginitis: Secondary | ICD-10-CM | POA: Insufficient documentation

## 2018-09-05 HISTORY — DX: Chlamydial infection, unspecified: A74.9

## 2018-09-05 NOTE — ED Triage Notes (Signed)
Pt states was treated for chlamydia last Tuesday at [facility] department. Pt states was given zithromax tablets. Pt presents today with vaginal discharge whitish and itching.

## 2018-09-06 LAB — WET PREP, GENITAL
Sperm: NONE SEEN
TRICH WET PREP: NONE SEEN
YEAST WET PREP: NONE SEEN

## 2018-09-06 LAB — POC URINE PREG, ED: PREG TEST UR: NEGATIVE

## 2018-09-06 MED ORDER — METRONIDAZOLE 500 MG PO TABS: 500.0000 mg | ORAL_TABLET | Freq: Two times a day (BID) | ORAL | 0 refills | Status: DC

## 2018-09-06 MED ORDER — METRONIDAZOLE 500 MG PO TABS
500.0000 mg | ORAL_TABLET | Freq: Once | ORAL | Status: AC
  Administered 2018-09-06: 500 mg via ORAL
  Filled 2018-09-06: qty 1

## 2018-09-06 NOTE — Discharge Instructions (Signed)
Flagyl is an antibiotic used to treat bacterial vaginosis. This medication CANNOT be taken with alcohol, because it can cause nausea and vomiting combined with alcohol. Please also refrain from drinking alcohol for 48 hours after you finish the medicine.  Use a condom with every sexual encounter Follow up with your doctor or OBGYN in regards to today's visit.   Please return to the ER for worsening symptoms, high fevers or persistent vomiting.  You have been tested for chlamydia and gonorrhea. These results will be available in approximately 3 days. You will be notified if they are positive.   It is very important to practice safe sex and use condoms when sexually active. If your results are positive you need to notify all sexual partners so they can be treated as well. The website https://garcia.net/ can be used to send anonymous text messages or emails to alert sexual contacts.   SEEK IMMEDIATE MEDICAL CARE IF:  You develop an oral temperature above 102 F (38.9 C), not controlled by medications or lasting more than 2 days.  You develop an increase in pain.  You develop vaginal bleeding and it is not time for your period.  You develop painful intercourse.

## 2018-09-06 NOTE — ED Provider Notes (Signed)
[facility] EMERGENCY DEPARTMENT Provider Note   CSN: [id number] Arrival date & time: 09/05/18  2223     History   Chief Complaint Chief Complaint  Patient presents with  . SEXUALLY TRANSMITTED DISEASE    HPI Tammy [name] is a 27 y.o. female.  HPI   Patient is a 27 year old female with a history of chlamydia and allergic rhinitis presenting for vaginal irritation and discharge.  Patient reports that she was treated 1 week ago at the health department for chlamydia.  She reports she was given a single dose of azithromycin.  Patient reports that over the past 2 to 3 days, she has had increasing irritation in the vaginal region.  Patient reports small amount of discharge.  Patient denies having any unprotected sexual intercourse since treatment for STI.  Patient sexual partner was also treated.  Patient denies any abdominal pain, nausea, vomiting, fever or chills.  Past Medical History:  Diagnosis Date  . Chlamydia   . Environmental allergies   . Medical history non-contributory     There are no active problems to display for this patient.   Past Surgical History:  Procedure Laterality Date  . wisdom teeth abstraction       OB History   None      Home Medications    Prior to Admission medications   Medication Sig Start Date End Date Taking? Authorizing Provider  albuterol (PROVENTIL HFA;VENTOLIN HFA) 108 (90 Base) MCG/ACT inhaler Inhale 1-2 puffs into the lungs every 6 (six) hours as needed for wheezing or shortness of breath.    [provider]  clarithromycin (BIAXIN) 500 MG tablet Take 1 tablet (500 mg total) by mouth 2 (two) times daily. Patient not taking: Reported on 09/16/2017 08/23/14   Ivery Quale, PA-C  metroNIDAZOLE (FLAGYL) 500 MG tablet Take 1 tablet (500 mg total) by mouth 2 (two) times daily. 09/06/18   Aviva Kluver B, PA-C  ondansetron (ZOFRAN ODT) 4 MG disintegrating tablet Take 1 tablet (4 mg total) by mouth every 8 (eight) hours  as needed for nausea or vomiting. 09/16/17   Samuel Jester, DO    Family History Family History  Problem Relation Age of Onset  . Hypertension Mother   . Diabetes Maternal Aunt   . Cancer Maternal Uncle   . Cancer Maternal Grandmother     Social History Social History   Tobacco Use  . Smoking status: Light Tobacco Smoker    Types: Cigarettes  . Smokeless tobacco: Never Used  Substance Use Topics  . Alcohol use: No  . Drug use: No     Allergies   Other; Peach flavor; and Penicillins   Review of Systems Review of Systems  Constitutional: Negative for chills and fever.  Gastrointestinal: Negative for abdominal pain, nausea and vomiting.  Genitourinary: Positive for vaginal discharge. Negative for dysuria, frequency, genital sores and pelvic pain.     Physical Exam Updated Vital Signs BP 121/70   Pulse 90   Ht 5\' 5"  (1.651 m)   Wt 97.1 kg   SpO2 100%   BMI 35.61 kg/m   Physical Exam  Constitutional: She appears well-developed and well-nourished. No distress.  HENT:  Head: Normocephalic and atraumatic.  Mouth/Throat: Oropharynx is clear and moist.  Eyes: Pupils are equal, round, and reactive to light. Conjunctivae and EOM are normal.  Neck: Normal range of motion. Neck supple.  Cardiovascular: Normal rate, regular rhythm, S1 normal and S2 normal.  Intact, 2+ radial pulse.   Pulmonary/Chest: Effort normal.  Patient converses comfortably without audible wheeze or stridor.  Abdominal: Soft. She exhibits no distension. There is no tenderness. There is no guarding.  Genitourinary:  Genitourinary Comments: Pelvic examination performed with RN chaperone present.  Patient has no lymphadenopathy of inguinal region.  No lesions of inguinal region or vulva.  Vaginal tissue pink and rugated.  Cervix not erythematous and nonfriable.  Small amount of milky white discharge around cervical os.  Bimanual exam without cervical motion tenderness, uterine tenderness, or  adnexal tenderness.  Musculoskeletal: Normal range of motion. She exhibits no edema or deformity.  Neurological: She is alert.  Cranial nerves grossly intact. Patient moves extremities symmetrically and with good coordination.  Skin: Skin is warm and dry. No rash noted. No erythema.  Psychiatric: She has a normal mood and affect. Her behavior is normal. Judgment and thought content normal.  Nursing note and vitals reviewed.    ED Treatments / Results  Labs (all labs ordered are listed, but only abnormal results are displayed) Labs Reviewed  WET PREP, GENITAL - Abnormal; Notable for the following components:      Result Value   Clue Cells Wet Prep HPF POC MANY (*)    WBC, Wet Prep HPF POC FEW (*)    All other components within normal limits  POC URINE PREG, ED  GC/CHLAMYDIA PROBE AMP ([place]) NOT AT [facility]    EKG None  Radiology No results found.  Procedures Procedures (including critical care time)  Medications Ordered in ED Medications  metroNIDAZOLE (FLAGYL) tablet 500 mg (500 mg Oral Given 09/06/18 0126)     Initial Impression / Assessment and Plan / ED Course  I have reviewed the triage vital signs and the nursing notes.  Pertinent labs & imaging results that were available during my care of the patient were reviewed by me and considered in my medical decision making (see chart for details).     Patient well-appearing and in no acute distress.  Pelvic examination not consistent with PID.  Wet prep with clue cells, and few WBCs.  Do not suspect recurrence of chlamydia.  Test of cure pending.  Patient prescribed Flagyl, and instructed not to drink alcohol while taking this medication.  Return precautions given for any pelvic pain, or nausea/vomiting.  Patient is in understanding and agrees with the plan of care.  Final Clinical Impressions(s) / ED Diagnoses   Final diagnoses:  Bacterial vaginosis    ED Discharge Orders         Ordered    metroNIDAZOLE  (FLAGYL) 500 MG tablet  2 times daily    Note to Pharmacy:  Please take with food   09/06/18 0117           Elisha Ponder, PA-C 09/06/18 0200    Zadie Rhine, MD 09/06/18 [phone]

## 2018-09-07 LAB — GC/CHLAMYDIA PROBE AMP (~~LOC~~) NOT AT ARMC
Chlamydia: NEGATIVE
Neisseria Gonorrhea: NEGATIVE

## 2018-10-24 ENCOUNTER — Encounter (HOSPITAL_COMMUNITY): Payer: Self-pay

## 2018-10-24 ENCOUNTER — Emergency Department (HOSPITAL_COMMUNITY)
Admission: EM | Admit: 2018-10-24 | Discharge: 2018-10-25 | Disposition: A | Discharge status: Home / Self Care | Payer: Self-pay | Attending: Emergency Medicine | Admitting: Emergency Medicine

## 2018-10-24 ENCOUNTER — Other Ambulatory Visit: Payer: Self-pay

## 2018-10-24 DIAGNOSIS — Z3202 Encounter for pregnancy test, result negative: Secondary | ICD-10-CM | POA: Insufficient documentation

## 2018-10-24 DIAGNOSIS — Z202 Contact with and (suspected) exposure to infections with a predominantly sexual mode of transmission: Secondary | ICD-10-CM | POA: Insufficient documentation

## 2018-10-24 DIAGNOSIS — Z79899 Other long term (current) drug therapy: Secondary | ICD-10-CM | POA: Insufficient documentation

## 2018-10-24 DIAGNOSIS — Z87891 Personal history of nicotine dependence: Secondary | ICD-10-CM | POA: Insufficient documentation

## 2018-10-24 LAB — URINALYSIS, ROUTINE W REFLEX MICROSCOPIC
BACTERIA UA: NONE SEEN
BILIRUBIN URINE: NEGATIVE
Glucose, UA: NEGATIVE mg/dL
Ketones, ur: NEGATIVE mg/dL
LEUKOCYTES UA: NEGATIVE
NITRITE: NEGATIVE
PH: 6 (ref 5.0–8.0)
Protein, ur: NEGATIVE mg/dL
SPECIFIC GRAVITY, URINE: 1.024 (ref 1.005–1.030)

## 2018-10-24 LAB — PREGNANCY, URINE: PREG TEST UR: NEGATIVE

## 2018-10-24 MED ORDER — AZITHROMYCIN 250 MG PO TABS
1000.0000 mg | ORAL_TABLET | Freq: Once | ORAL | Status: AC
  Administered 2018-10-25: 1000 mg via ORAL
  Filled 2018-10-24: qty 4

## 2018-10-24 MED ORDER — CEFTRIAXONE SODIUM 250 MG IJ SOLR
250.0000 mg | Freq: Once | INTRAMUSCULAR | Status: AC
  Administered 2018-10-25: 250 mg via INTRAMUSCULAR
  Filled 2018-10-24: qty 250

## 2018-10-24 NOTE — ED Provider Notes (Signed)
[facility] EMERGENCY DEPARTMENT Provider Note   CSN: [id number] Arrival date & time: 10/24/18  2235     History   Chief Complaint Chief Complaint  Patient presents with  . SEXUALLY TRANSMITTED DISEASE    HPI Tammy [name] is a 27 y.o. female.  HPI   Tammy [name] is a 27 y.o. female who presents to the Emergency Department requesting evaluation for possible chlamydia infection.  She states that her boyfriend was seen at the health department 2 weeks ago and was told to return today for a positive chlamydia test.  She denies any symptoms currently, but states that she wants to also be treated for possible infection.  She denies abdominal pain, fever, vaginal discharge, abnormal menses and genital rash or lesions.   Past Medical History:  Diagnosis Date  . Chlamydia   . Environmental allergies   . Medical history non-contributory     There are no active problems to display for this patient.   Past Surgical History:  Procedure Laterality Date  . wisdom teeth abstraction       OB History   None      Home Medications    Prior to Admission medications   Medication Sig Start Date End Date Taking? Authorizing Provider  albuterol (PROVENTIL HFA;VENTOLIN HFA) 108 (90 Base) MCG/ACT inhaler Inhale 1-2 puffs into the lungs every 6 (six) hours as needed for wheezing or shortness of breath.    [provider]  clarithromycin (BIAXIN) 500 MG tablet Take 1 tablet (500 mg total) by mouth 2 (two) times daily. Patient not taking: Reported on 09/16/2017 08/23/14   Ivery Quale, PA-C  metroNIDAZOLE (FLAGYL) 500 MG tablet Take 1 tablet (500 mg total) by mouth 2 (two) times daily. 09/06/18   Aviva Kluver B, PA-C  ondansetron (ZOFRAN ODT) 4 MG disintegrating tablet Take 1 tablet (4 mg total) by mouth every 8 (eight) hours as needed for nausea or vomiting. 09/16/17   Samuel Jester, DO    Family History Family History  Problem Relation Age of Onset  .  Hypertension Mother   . Diabetes Maternal Aunt   . Cancer Maternal Uncle   . Cancer Maternal Grandmother     Social History Social History   Tobacco Use  . Smoking status: Former Smoker    Types: Cigarettes  . Smokeless tobacco: Never Used  Substance Use Topics  . Alcohol use: No  . Drug use: No     Allergies   Other; Peach flavor; and Penicillins   Review of Systems Review of Systems  Constitutional: Negative for chills, fatigue and fever.  HENT: Negative for sore throat and trouble swallowing.   Respiratory: Negative for cough and shortness of breath.   Cardiovascular: Negative for chest pain.  Gastrointestinal: Negative for abdominal pain, nausea and vomiting.  Genitourinary: Negative for dysuria, genital sores, menstrual problem, pelvic pain, vaginal bleeding, vaginal discharge and vaginal pain.  Musculoskeletal: Negative for arthralgias, back pain and myalgias.  Skin: Negative for rash.  Neurological: Negative for dizziness, weakness and numbness.  Hematological: Does not bruise/bleed easily.     Physical Exam Updated Vital Signs BP 126/61 (BP Location: Right Arm)   Pulse 97   Temp 97.9 F (36.6 C) (Oral)   Resp 18   Ht 5\' 5"  (1.651 m)   Wt 97.5 kg   SpO2 100%   BMI 35.78 kg/m   Physical Exam  Constitutional: She appears well-developed and well-nourished. No distress.  HENT:  Head: Atraumatic.  Mouth/Throat: Oropharynx  is clear and moist.  Cardiovascular: Normal rate and regular rhythm.  Pulmonary/Chest: Effort normal and breath sounds normal. No respiratory distress.  Abdominal: Soft. She exhibits no distension and no mass. There is no tenderness. There is no guarding.  Genitourinary:  Genitourinary Comments: Pelvic exam deferred. Pt is asymptomatic.    Musculoskeletal: Normal range of motion.  Neurological: She is alert. No sensory deficit.  Skin: Skin is warm. Capillary refill takes less than 2 seconds. No rash noted.  Nursing note and vitals  reviewed.    ED Treatments / Results  Labs (all labs ordered are listed, but only abnormal results are displayed) Labs Reviewed  URINALYSIS, ROUTINE W REFLEX MICROSCOPIC - Abnormal; Notable for the following components:      Result Value   APPearance HAZY (*)    Hgb urine dipstick MODERATE (*)    All other components within normal limits  PREGNANCY, URINE  GC/CHLAMYDIA PROBE AMP ([place]) NOT [facility]    EKG None  Radiology No results found.  Procedures Procedures (including critical care time)  Medications Ordered in ED Medications  azithromycin (ZITHROMAX) tablet 1,000 mg (has no administration in time range)  cefTRIAXone (ROCEPHIN) injection 250 mg (has no administration in time range)     Initial Impression / Assessment and Plan / ED Course  I have reviewed the triage vital signs and the nursing notes.  Pertinent labs & imaging results that were available during my care of the patient were reviewed by me and considered in my medical decision making (see chart for details).    Patient well-appearing.  Denies symptoms.  Here requesting medication for possible chlamydia infection.  Discussed safe sex practices.  Patient given IM Rocephin and p.o. Zithromax here.  Abdomen soft and nontender.  Patient appropriate for discharge home and she agrees to follow-up with the health department if needed.  Final Clinical Impressions(s) / ED Diagnoses   Final diagnoses:  Possible exposure to STD    ED Discharge Orders    None       Pauline Ausriplett, Sheilla Maris, PA-C 10/25/18 0013    Zadie RhineWickline, Donald, MD 10/25/18 [phone]

## 2018-10-24 NOTE — ED Triage Notes (Signed)
Pt presents to ED to be checked for Chlamydia. Pt states her boyfriend was checked at the Health Dept and treated today for a positive result for Chlamydia. Pt denies vaginal discharge, no itching or irritation.

## 2018-10-25 MED ORDER — STERILE WATER FOR INJECTION IJ SOLN
INTRAMUSCULAR | Status: AC
  Filled 2018-10-25: qty 10

## 2018-10-25 NOTE — ED Notes (Signed)
Pt ambulatory to waiting room. Pt verbalized understanding of discharge instructions.   

## 2018-10-25 NOTE — Discharge Instructions (Addendum)
You will be notified of any positive culture reports.  You have been treated here tonight with antibiotics.  Follow-up with your primary doctor or with the health department if needed.

## 2018-10-26 LAB — GC/CHLAMYDIA PROBE AMP (~~LOC~~) NOT AT ARMC
Chlamydia: NEGATIVE
NEISSERIA GONORRHEA: NEGATIVE

## 2018-11-25 ENCOUNTER — Encounter (HOSPITAL_COMMUNITY): Payer: Self-pay | Admitting: Emergency Medicine

## 2018-11-25 ENCOUNTER — Emergency Department (HOSPITAL_COMMUNITY)
Admission: EM | Admit: 2018-11-25 | Discharge: 2018-11-25 | Disposition: A | Discharge status: Home / Self Care | Payer: Self-pay | Attending: Emergency Medicine | Admitting: Emergency Medicine

## 2018-11-25 DIAGNOSIS — J111 Influenza due to unidentified influenza virus with other respiratory manifestations: Secondary | ICD-10-CM | POA: Insufficient documentation

## 2018-11-25 DIAGNOSIS — R69 Illness, unspecified: Secondary | ICD-10-CM

## 2018-11-25 DIAGNOSIS — Z87891 Personal history of nicotine dependence: Secondary | ICD-10-CM | POA: Insufficient documentation

## 2018-11-25 MED ORDER — PROMETHAZINE-DM 6.25-15 MG/5ML PO SYRP: 5.0000 mL | ORAL_SOLUTION | Freq: Four times a day (QID) | ORAL | 0 refills | Status: DC | PRN

## 2018-11-25 MED ORDER — IBUPROFEN 800 MG PO TABS: 800.0000 mg | ORAL_TABLET | Freq: Three times a day (TID) | ORAL | 0 refills | Status: DC

## 2018-11-25 MED ORDER — MAGIC MOUTHWASH W/LIDOCAINE: 5.0000 mL | Freq: Three times a day (TID) | ORAL | 0 refills | Status: DC | PRN

## 2018-11-25 NOTE — Discharge Instructions (Addendum)
It is important that you rest and drink plenty of fluids.  Take over-the-counter Tylenol every 4 hours if needed for fever.  The cough syrup that you were prescribed may cause drowsiness.  Follow-up with your primary provider for recheck if needed return here for any worsening symptoms.

## 2018-11-25 NOTE — ED Triage Notes (Signed)
Pt states she has had aches, itchy throat, chills, and runny nose since last week and thinks she has the flu.  Has not checked temp at home and did not get a flu shot.

## 2018-11-25 NOTE — ED Provider Notes (Signed)
[facility] EMERGENCY DEPARTMENT Provider Note   CSN: [id number] Arrival date & time: 11/25/18  1636     History   Chief Complaint Chief Complaint  Patient presents with  . Generalized Body Aches    HPI Tammy [name] is a 28 y.o. female.  HPI   Tammy [name] is a 28 y.o. female who presents to the Emergency Department complaining of generalized body aches, cough, sore throat, nasal congestion and stuffy nose.  Symptoms have been present for 3 to 4 days.  She states they have been gradually worsening in severity.  Cough is described as productive of clear to yellow sputum.  She also complains of decreased appetite and states her throat hurts when she attempts to swallow solid foods.  She has been drinking water without difficulty.  She is unsure of fever at home, but she does endorse intermittent sweats and chills.  She also states that she had to leave work today due to her symptoms and states that her supervisor has threatened to fire her if she does not bring in a work excuse.  She states she has been exposed to others with the flu.   Past Medical History:  Diagnosis Date  . Chlamydia   . Environmental allergies   . Medical history non-contributory     There are no active problems to display for this patient.   Past Surgical History:  Procedure Laterality Date  . wisdom teeth abstraction       OB History   No obstetric history on file.      Home Medications    Prior to Admission medications   Medication Sig Start Date End Date Taking? Authorizing Provider  albuterol (PROVENTIL HFA;VENTOLIN HFA) 108 (90 Base) MCG/ACT inhaler Inhale 1-2 puffs into the lungs every 6 (six) hours as needed for wheezing or shortness of breath.    [provider]  clarithromycin (BIAXIN) 500 MG tablet Take 1 tablet (500 mg total) by mouth 2 (two) times daily. Patient not taking: Reported on 09/16/2017 08/23/14   Ivery Quale, PA-C  metroNIDAZOLE (FLAGYL) 500 MG tablet  Take 1 tablet (500 mg total) by mouth 2 (two) times daily. 09/06/18   Aviva Kluver B, PA-C  ondansetron (ZOFRAN ODT) 4 MG disintegrating tablet Take 1 tablet (4 mg total) by mouth every 8 (eight) hours as needed for nausea or vomiting. 09/16/17   Samuel Jester, DO    Family History Family History  Problem Relation Age of Onset  . Hypertension Mother   . Diabetes Maternal Aunt   . Cancer Maternal Uncle   . Cancer Maternal Grandmother     Social History Social History   Tobacco Use  . Smoking status: Former Smoker    Types: Cigarettes  . Smokeless tobacco: Never Used  Substance Use Topics  . Alcohol use: No  . Drug use: No     Allergies   Other; Peach flavor; and Penicillins   Review of Systems Review of Systems  Constitutional: Positive for appetite change and chills. Negative for activity change and fever.       Intermittent sweats  HENT: Positive for congestion, rhinorrhea and sore throat. Negative for ear pain, facial swelling, trouble swallowing and voice change.   Eyes: Negative for visual disturbance.  Respiratory: Positive for cough. Negative for shortness of breath, wheezing and stridor.   Cardiovascular: Negative for chest pain.  Gastrointestinal: Positive for nausea. Negative for abdominal pain, diarrhea and vomiting.  Genitourinary: Negative for dysuria.  Musculoskeletal: Positive for  myalgias (Generalized body aches). Negative for neck pain and neck stiffness.  Skin: Negative for rash.  Neurological: Negative for dizziness, syncope, weakness, numbness and headaches.  Hematological: Negative for adenopathy.  Psychiatric/Behavioral: Negative for confusion.     Physical Exam Updated Vital Signs BP 134/82 (BP Location: Right Arm)   Pulse (!) 106   Temp 98.8 F (37.1 C) (Oral)   Resp 18   Ht 5\' 5"  (1.651 m)   Wt 95.3 kg   SpO2 100%   BMI 34.95 kg/m   Physical Exam Vitals signs and nursing note reviewed.  Constitutional:      General: She is  not in acute distress.    Appearance: Normal appearance. She is not ill-appearing or toxic-appearing.  HENT:     Head: Atraumatic.     Right Ear: Tympanic membrane and ear canal normal.     Left Ear: Tympanic membrane and ear canal normal.     Nose: Mucosal edema and rhinorrhea present.     Mouth/Throat:     Mouth: Mucous membranes are moist.     Pharynx: Oropharynx is clear. Uvula midline. Posterior oropharyngeal erythema present. No oropharyngeal exudate or uvula swelling.  Neck:     Musculoskeletal: Normal range of motion.  Cardiovascular:     Rate and Rhythm: Normal rate and regular rhythm.     Pulses: Normal pulses.  Pulmonary:     Effort: Pulmonary effort is normal. No respiratory distress.     Breath sounds: No wheezing or rales.  Musculoskeletal: Normal range of motion.        General: No swelling.  Skin:    General: Skin is warm.     Capillary Refill: Capillary refill takes less than 2 seconds.     Findings: No rash.  Neurological:     General: No focal deficit present.     Mental Status: She is alert and oriented to person, place, and time.     Motor: No weakness.      ED Treatments / Results  Labs (all labs ordered are listed, but only abnormal results are displayed) Labs Reviewed - No data to display  EKG None  Radiology No results found.  Procedures Procedures (including critical care time)  Medications Ordered in ED Medications - No data to display   Initial Impression / Assessment and Plan / ED Course  I have reviewed the triage vital signs and the nursing notes.  Pertinent labs & imaging results that were available during my care of the patient were reviewed by me and considered in my medical decision making (see chart for details).     Patient well-appearing.  Nontoxic.  Symptoms are likely viral.  Mucous membranes are moist and no clinical signs of dehydration.  Patient on her cell phone when I entered the room. Symptoms present beyond 48  hours, so I feel that prescibingTamiflu is less beneficial at this point.  Patient agrees to symptomatic treatment and close PCP follow-up.  Return precautions discussed.   Final Clinical Impressions(s) / ED Diagnoses   Final diagnoses:  Influenza-like illness    ED Discharge Orders    None       Pauline Aus, PA-C 11/28/18 0029    Donnetta Hutching, MD 12/01/18 1540

## 2019-12-16 ENCOUNTER — Encounter (HOSPITAL_COMMUNITY): Payer: Self-pay | Admitting: Emergency Medicine

## 2019-12-16 ENCOUNTER — Emergency Department (HOSPITAL_COMMUNITY)
Admission: EM | Admit: 2019-12-16 | Discharge: 2019-12-16 | Disposition: A | Discharge status: Home / Self Care | Payer: Self-pay | Attending: Emergency Medicine | Admitting: Emergency Medicine

## 2019-12-16 ENCOUNTER — Other Ambulatory Visit: Payer: Self-pay

## 2019-12-16 DIAGNOSIS — B3731 Acute candidiasis of vulva and vagina: Secondary | ICD-10-CM

## 2019-12-16 DIAGNOSIS — B373 Candidiasis of vulva and vagina: Secondary | ICD-10-CM | POA: Insufficient documentation

## 2019-12-16 DIAGNOSIS — Z87891 Personal history of nicotine dependence: Secondary | ICD-10-CM | POA: Insufficient documentation

## 2019-12-16 MED ORDER — FLUCONAZOLE 200 MG PO TABS: 200.0000 mg | ORAL_TABLET | Freq: Every day | ORAL | 0 refills | Status: DC

## 2019-12-16 MED ORDER — FLUCONAZOLE 100 MG PO TABS
200.0000 mg | ORAL_TABLET | Freq: Once | ORAL | Status: DC
Start: 2019-12-16 — End: 2019-12-16
  Filled 2019-12-16: qty 2

## 2019-12-16 NOTE — ED Provider Notes (Signed)
[facility] EMERGENCY DEPARTMENT Provider Note   CSN: [id number] Arrival date & time: 12/16/19  0037     History Chief Complaint  Patient presents with  . Vaginal Discharge    Tammy [name] is a 29 y.o. female.  Recently treated with abx for STD. That cleared up now she has chunky white discharge similar to previous episodes of yeast infection. No abdominal pain.    Vaginal Discharge Quality:  Thick and white Severity:  Moderate Duration:  2 days Timing:  Constant Context: recent antibiotic use   Context: not after intercourse   Relieved by:  None tried Worsened by:  Nothing Ineffective treatments:  None tried Associated symptoms: no abdominal pain, no dysuria, no fever, no nausea and no urinary frequency        Past Medical History:  Diagnosis Date  . Chlamydia   . Environmental allergies   . Medical history non-contributory     There are no problems to display for this patient.   Past Surgical History:  Procedure Laterality Date  . wisdom teeth abstraction       OB History   No obstetric history on file.     Family History  Problem Relation Age of Onset  . Hypertension Mother   . Diabetes Maternal Aunt   . Cancer Maternal Uncle   . Cancer Maternal Grandmother     Social History   Tobacco Use  . Smoking status: Former Smoker    Types: Cigarettes  . Smokeless tobacco: Never Used  Substance Use Topics  . Alcohol use: No  . Drug use: No    Home Medications Prior to Admission medications   Medication Sig Start Date End Date Taking? Authorizing Provider  albuterol (PROVENTIL HFA;VENTOLIN HFA) 108 (90 Base) MCG/ACT inhaler Inhale 1-2 puffs into the lungs every 6 (six) hours as needed for wheezing or shortness of breath.    [provider]  fluconazole (DIFLUCAN) 200 MG tablet Take 1 tablet (200 mg total) by mouth daily. Start In 3-5 days if symptoms don't improve 12/16/19   Charan Prieto, Barbara Cower, MD  ibuprofen (ADVIL,MOTRIN) 800 MG tablet  Take 1 tablet (800 mg total) by mouth 3 (three) times daily. 11/25/18   Triplett, Tammy, PA-C  magic mouthwash w/lidocaine SOLN Take 5 mLs by mouth 3 (three) times daily as needed for mouth pain. Swish and spit, do not swallow 11/25/18   Triplett, Tammy, PA-C  metroNIDAZOLE (FLAGYL) 500 MG tablet Take 1 tablet (500 mg total) by mouth 2 (two) times daily. 09/06/18   Aviva Kluver B, PA-C  ondansetron (ZOFRAN ODT) 4 MG disintegrating tablet Take 1 tablet (4 mg total) by mouth every 8 (eight) hours as needed for nausea or vomiting. 09/16/17   Samuel Jester, DO  promethazine-dextromethorphan (PROMETHAZINE-DM) 6.25-15 MG/5ML syrup Take 5 mLs by mouth 4 (four) times daily as needed. 11/25/18   Triplett, Tammy, PA-C    Allergies    Other, Peach flavor, and Penicillins  Review of Systems   Review of Systems  Constitutional: Negative for fever.  Gastrointestinal: Negative for abdominal pain and nausea.  Genitourinary: Positive for vaginal discharge. Negative for dysuria.  All other systems reviewed and are negative.   Physical Exam Updated Vital Signs BP 124/83 (BP Location: Right Arm)   Pulse 82   Temp 97.9 F (36.6 C) (Oral)   Resp 18   Ht 5\' 5"  (1.651 m)   Wt 99.8 kg   SpO2 100%   BMI 36.61 kg/m   Physical Exam Vitals and  nursing note reviewed.  Constitutional:      Appearance: She is well-developed.  HENT:     Head: Normocephalic and atraumatic.     Mouth/Throat:     Mouth: Mucous membranes are dry.     Pharynx: Oropharynx is clear.  Eyes:     Conjunctiva/sclera: Conjunctivae normal.  Cardiovascular:     Rate and Rhythm: Normal rate and regular rhythm.  Pulmonary:     Effort: No respiratory distress.     Breath sounds: No stridor.  Abdominal:     General: Abdomen is flat. There is no distension.  Musculoskeletal:        General: No swelling or tenderness. Normal range of motion.     Cervical back: Normal range of motion.  Skin:    General: Skin is warm and dry.    Neurological:     Mental Status: She is alert.     ED Results / Procedures / Treatments   Labs (all labs ordered are listed, but only abnormal results are displayed) Labs Reviewed - No data to display  EKG None  Radiology No results found.  Procedures Procedures (including critical care time)  Medications Ordered in ED Medications  fluconazole (DIFLUCAN) tablet 200 mg (has no administration in time range)    ED Course  I have reviewed the triage vital signs and the nursing notes.  Pertinent labs & imaging results that were available during my care of the patient were reviewed by me and considered in my medical decision making (see chart for details).    MDM Rules/Calculators/A&P                      Likely yeast infection as it is similar to past. Shared decision-making, will defer pelvic exam at this time, will treat. Abdomen benign, doubt PID or other emergent causes at this time.    Final Clinical Impression(s) / ED Diagnoses Final diagnoses:  Yeast vaginitis    Rx / DC Orders ED Discharge Orders         Ordered    fluconazole (DIFLUCAN) 200 MG tablet  Daily     12/16/19 0103           Dallen Bunte, Corene Cornea, MD 12/16/19 [phone]

## 2019-12-16 NOTE — ED Triage Notes (Signed)
Pt states she has had a "white/ chunky vaginal discharge that itches and burns" x two days. Pt recently treated with antibiotic for STD.

## 2020-01-16 ENCOUNTER — Encounter (HOSPITAL_COMMUNITY): Payer: Self-pay | Admitting: Emergency Medicine

## 2020-01-16 ENCOUNTER — Other Ambulatory Visit: Payer: Self-pay

## 2020-01-16 ENCOUNTER — Emergency Department (HOSPITAL_COMMUNITY)
Admission: EM | Admit: 2020-01-16 | Discharge: 2020-01-16 | Disposition: A | Discharge status: Home / Self Care | Payer: Self-pay | Attending: Emergency Medicine | Admitting: Emergency Medicine

## 2020-01-16 DIAGNOSIS — R197 Diarrhea, unspecified: Secondary | ICD-10-CM | POA: Insufficient documentation

## 2020-01-16 DIAGNOSIS — Z79899 Other long term (current) drug therapy: Secondary | ICD-10-CM | POA: Insufficient documentation

## 2020-01-16 DIAGNOSIS — Z87891 Personal history of nicotine dependence: Secondary | ICD-10-CM | POA: Insufficient documentation

## 2020-01-16 DIAGNOSIS — Z3202 Encounter for pregnancy test, result negative: Secondary | ICD-10-CM | POA: Insufficient documentation

## 2020-01-16 DIAGNOSIS — R112 Nausea with vomiting, unspecified: Secondary | ICD-10-CM | POA: Insufficient documentation

## 2020-01-16 LAB — COMPREHENSIVE METABOLIC PANEL
ALT: 17 U/L (ref 0–44)
AST: 14 U/L — ABNORMAL LOW (ref 15–41)
Albumin: 4.4 g/dL (ref 3.5–5.0)
Alkaline Phosphatase: 53 U/L (ref 38–126)
Anion gap: 7 (ref 5–15)
BUN: 8 mg/dL (ref 6–20)
CO2: 25 mmol/L (ref 22–32)
Calcium: 9.4 mg/dL (ref 8.9–10.3)
Chloride: 105 mmol/L (ref 98–111)
Creatinine, Ser: 0.75 mg/dL (ref 0.44–1.00)
GFR calc Af Amer: >60 mL/min (ref >60)
GFR calc non Af Amer: >60 mL/min (ref >60)
Glucose, Bld: 115 mg/dL — ABNORMAL HIGH (ref 70–99)
Potassium: 4.1 mmol/L (ref 3.5–5.1)
Sodium: 137 mmol/L (ref 135–145)
Total Bilirubin: 0.8 mg/dL (ref 0.3–1.2)
Total Protein: 7.6 g/dL (ref 6.5–8.1)

## 2020-01-16 LAB — CBC
HCT: 39.9 % (ref 36.0–46.0)
Hemoglobin: 12.8 g/dL (ref 12.0–15.0)
MCH: 31.2 pg (ref 26.0–34.0)
MCHC: 32.1 g/dL (ref 30.0–36.0)
MCV: 97.3 fL (ref 80.0–100.0)
Platelets: 375 10*3/uL (ref 150–400)
RBC: 4.1 MIL/uL (ref 3.87–5.11)
RDW: 13.8 % (ref 11.5–15.5)
WBC: 8.2 10*3/uL (ref 4.0–10.5)
nRBC: 0 % (ref 0.0–0.2)

## 2020-01-16 LAB — PREGNANCY, URINE: Preg Test, Ur: NEGATIVE

## 2020-01-16 NOTE — ED Provider Notes (Addendum)
[facility] EMERGENCY DEPARTMENT Provider Note   CSN: [id number] Arrival date & time: 01/16/20  2992     History Chief Complaint  Patient presents with  . Emesis    one episode of emesis this morning. COVID negative needs work note to return to work     Tammy [name] is a 29 y.o. female.  Patient c/o nausea, vomiting and diarrhea, general malaise, body aches. Symptoms acute onset 2 days ago, episodic, moderate, persistent. States work told her she needed to get checked before returning to work. Emesis clear to sl yellow, no bloody or bilious emesis. Diarrhea watery, not bloody. No abd pain. No back or flank pain. No dysuria or gu c/o. No fever. 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 No sore throat or trouble swallowing. Had occasional non prod cough, improved.  No known covid exposure. Pt states she checked her blood sugar and it was 'borderline low'.   The history is provided by the patient.  Emesis Associated symptoms: diarrhea and myalgias   Associated symptoms: no abdominal pain, no chills, no fever, no headaches and no sore throat        Past Medical History:  Diagnosis Date  . Chlamydia   . Environmental allergies   . Medical history non-contributory     There are no problems to display for this patient.   Past Surgical History:  Procedure Laterality Date  . wisdom teeth abstraction       OB History   No obstetric history on file.     Family History  Problem Relation Age of Onset  . Hypertension Mother   . Diabetes Maternal Aunt   . Cancer Maternal Uncle   . Cancer Maternal Grandmother     Social History   Tobacco Use  . Smoking status: Former Smoker    Types: Cigarettes  . Smokeless tobacco: Never Used  Substance Use Topics  . Alcohol use: No  . Drug use: No    Home Medications Prior to Admission medications   Medication Sig Start Date End Date Taking? Authorizing  Provider  albuterol (PROVENTIL HFA;VENTOLIN HFA) 108 (90 Base) MCG/ACT inhaler Inhale 1-2 puffs into the lungs every 6 (six) hours as needed for wheezing or shortness of breath.    [provider]  fluconazole (DIFLUCAN) 200 MG tablet Take 1 tablet (200 mg total) by mouth daily. Start In 3-5 days if symptoms don't improve 12/16/19   Mesner, Corene Cornea, MD  ibuprofen (ADVIL,MOTRIN) 800 MG tablet Take 1 tablet (800 mg total) by mouth 3 (three) times daily. 11/25/18   Triplett, Tammy, PA-C  magic mouthwash w/lidocaine SOLN Take 5 mLs by mouth 3 (three) times daily as needed for mouth pain. Swish and spit, do not swallow 11/25/18   Triplett, Tammy, PA-C  metroNIDAZOLE (FLAGYL) 500 MG tablet Take 1 tablet (500 mg total) by mouth 2 (two) times daily. 09/06/18   Langston Masker B, PA-C  ondansetron (ZOFRAN ODT) 4 MG disintegrating tablet Take 1 tablet (4 mg total) by mouth every 8 (eight) hours as needed for nausea or vomiting. 09/16/17   Francine Graven, DO  promethazine-dextromethorphan (PROMETHAZINE-DM) 6.25-15 MG/5ML syrup Take 5 mLs by mouth 4 (four) times daily as needed. 11/25/18   Triplett, Tammy, PA-C    Allergies    Other, Peach flavor, and Penicillins  Review of Systems   Review of Systems  Constitutional: Negative for chills and fever.  HENT: Negative for sore throat.  Eyes: Negative for redness.  Respiratory: Negative for shortness of breath.   Cardiovascular: Negative for chest pain.  Gastrointestinal: Positive for diarrhea and vomiting. Negative for abdominal pain.  Genitourinary: Negative for dysuria and flank pain.  Musculoskeletal: Positive for myalgias. Negative for neck pain and neck stiffness.  Skin: Negative for rash.  Neurological: Negative for headaches.  Hematological: Does not bruise/bleed easily.  Psychiatric/Behavioral: Negative for confusion.    Physical Exam Updated Vital Signs BP (!) 148/88 (BP Location: Left Arm)   Pulse 83   Temp 99.1 F (37.3 C) (Oral)    Resp 18   Ht 1.651 m (5\' 5" )   Wt 99.8 kg   SpO2 100%   BMI 36.61 kg/m   Physical Exam Vitals and nursing note reviewed.  Constitutional:      Appearance: Normal appearance. She is well-developed.  HENT:     Head: Atraumatic.     Nose: Nose normal.     Mouth/Throat:     Mouth: Mucous membranes are moist.     Pharynx: Oropharynx is clear. No oropharyngeal exudate or posterior oropharyngeal erythema.  Eyes:     General: No scleral icterus.    Conjunctiva/sclera: Conjunctivae normal.  Neck:     Trachea: No tracheal deviation.  Cardiovascular:     Rate and Rhythm: Normal rate and regular rhythm.     Pulses: Normal pulses.     Heart sounds: Normal heart sounds. No murmur. No friction rub. No gallop.   Pulmonary:     Effort: Pulmonary effort is normal. No respiratory distress.     Breath sounds: Normal breath sounds.  Abdominal:     General: Bowel sounds are normal. There is no distension.     Palpations: Abdomen is soft. There is no mass.     Tenderness: There is no abdominal tenderness. There is no guarding or rebound.     Hernia: No hernia is present.  Genitourinary:    Comments: No cva tenderness.  Musculoskeletal:        General: No swelling.     Cervical back: Normal range of motion and neck supple. No rigidity. No muscular tenderness.  Skin:    General: Skin is warm and dry.     Findings: No rash.  Neurological:     Mental Status: She is alert.     Comments: Alert, speech normal. Steady gait.   Psychiatric:        Mood and Affect: Mood normal.     ED Results / Procedures / Treatments   Labs (all labs ordered are listed, but only abnormal results are displayed) Results for orders placed or performed during the hospital encounter of 01/16/20  Comprehensive metabolic panel  Result Value Ref Range   Sodium 137 135 - 145 mmol/L   Potassium 4.1 3.5 - 5.1 mmol/L   Chloride 105 98 - 111 mmol/L   CO2 25 22 - 32 mmol/L   Glucose, Bld 115 (H) 70 - 99 mg/dL   BUN 8 6  - 20 mg/dL   Creatinine, Ser 01/18/20 0.44 - 1.00 mg/dL   Calcium 9.4 8.9 - 6.96 mg/dL   Total Protein 7.6 6.5 - 8.1 g/dL   Albumin 4.4 3.5 - 5.0 g/dL   AST 14 (L) 15 - 41 U/L   ALT 17 0 - 44 U/L   Alkaline Phosphatase 53 38 - 126 U/L   Total Bilirubin 0.8 0.3 - 1.2 mg/dL   GFR calc non Af Amer >60 >60 mL/min   GFR calc  Af Amer >60 >60 mL/min   Anion gap 7 5 - 15  CBC  Result Value Ref Range   WBC 8.2 4.0 - 10.5 K/uL   RBC 4.10 3.87 - 5.11 MIL/uL   Hemoglobin 12.8 12.0 - 15.0 g/dL   HCT 40.9 81.1 - 91.4 %   MCV 97.3 80.0 - 100.0 fL   MCH 31.2 26.0 - 34.0 pg   MCHC 32.1 30.0 - 36.0 g/dL   RDW 78.2 95.6 - 21.3 %   Platelets 375 150 - 400 K/uL   nRBC 0.0 0.0 - 0.2 %  Pregnancy, urine  Result Value Ref Range   Preg Test, Ur NEGATIVE NEGATIVE    EKG None  Radiology No results found.  Procedures Procedures (including critical care time)  Medications Ordered in ED Medications - No data to display  ED Course  I have reviewed the triage vital signs and the nursing notes.  Pertinent labs & imaging results that were available during my care of the patient were reviewed by me and considered in my medical decision making (see chart for details).    MDM Rules/Calculators/A&P                      Labs sent. Trial of po fluids.  Reviewed nursing notes and prior charts for additional history.  In Care Everywhere, noted to have several prior covid tests (9), including one from 2 days ago - negative.   Tammy [name] was evaluated in Emergency Department on 01/16/2020 for the symptoms described in the history of present illness. She was evaluated in the context of the global COVID-19 pandemic, which necessitated consideration that the patient might be at risk for infection with the SARS-CoV-2 virus that causes COVID-19. Institutional protocols and algorithms that pertain to the evaluation of patients at risk for COVID-19 are in a state of rapid change based on information released by  regulatory bodies including the CDC and federal and state organizations. These policies and algorithms were followed during the patient's care in the ED.  Labs reviewed/interpreted by me - chem normal.   Patient is tolerating po. Abd is soft, nt.  No episodes of vomiting or diarrhea in ED. ?recent viral illness, improving.   Patient currently appears stable for d/c.   Patient requests work note for 2 days - provided.   Return precautions provided.      Final Clinical Impression(s) / ED Diagnoses Final diagnoses:  None    Rx / DC Orders ED Discharge Orders    None        Cathren Laine, MD 01/16/20 1129

## 2020-01-16 NOTE — ED Triage Notes (Signed)
Pt reports emesis,diarrhea, intermittent dizziness, syncope/hypoglycemia episode x1 over the last 3 days. Pt reports loss of appetite

## 2020-01-16 NOTE — Discharge Instructions (Addendum)
It was our pleasure to provide your ER care today - we hope that you feel better.  Rest. Drink plenty of fluids.  Follow up with primary care doctor in the coming week if symptoms fail to improve/resolve.  Return to ER if worse, new symptoms, persistent vomiting, severe abdominal pain, or other concern.

## 2020-02-26 ENCOUNTER — Ambulatory Visit: Payer: Self-pay | Admitting: Physician Assistant

## 2021-02-02 ENCOUNTER — Other Ambulatory Visit: Payer: Self-pay

## 2021-02-02 ENCOUNTER — Encounter (HOSPITAL_COMMUNITY): Payer: Self-pay

## 2021-02-02 ENCOUNTER — Emergency Department (HOSPITAL_COMMUNITY)
Admission: EM | Admit: 2021-02-02 | Discharge: 2021-02-02 | Disposition: A | Discharge status: Home / Self Care | Payer: BC Managed Care – PPO | Attending: Emergency Medicine | Admitting: Emergency Medicine

## 2021-02-02 DIAGNOSIS — Z3A01 Less than 8 weeks gestation of pregnancy: Secondary | ICD-10-CM | POA: Diagnosis not present

## 2021-02-02 DIAGNOSIS — O26891 Other specified pregnancy related conditions, first trimester: Secondary | ICD-10-CM | POA: Insufficient documentation

## 2021-02-02 DIAGNOSIS — R110 Nausea: Secondary | ICD-10-CM | POA: Insufficient documentation

## 2021-02-02 DIAGNOSIS — Z87891 Personal history of nicotine dependence: Secondary | ICD-10-CM | POA: Diagnosis not present

## 2021-02-02 LAB — URINALYSIS, ROUTINE W REFLEX MICROSCOPIC
Bilirubin Urine: NEGATIVE
Glucose, UA: NEGATIVE mg/dL
Ketones, ur: NEGATIVE mg/dL
Nitrite: NEGATIVE
Protein, ur: NEGATIVE mg/dL
Specific Gravity, Urine: 1.012 (ref 1.005–1.030)
pH: 6 (ref 5.0–8.0)

## 2021-02-02 LAB — I-STAT BETA HCG BLOOD, ED (MC, WL, AP ONLY): I-stat hCG, quantitative: >2000 m[IU]/mL — ABNORMAL HIGH (ref <5)

## 2021-02-02 MED ORDER — VITAMIN B-6 25 MG PO TABS: 25.0000 mg | ORAL_TABLET | Freq: Four times a day (QID) | ORAL | 0 refills | Status: DC | PRN

## 2021-02-02 MED ORDER — VITAMIN B-6 50 MG PO TABS
25.0000 mg | ORAL_TABLET | Freq: Every day | ORAL | Status: DC
Start: 2021-02-02 — End: 2021-02-02
  Administered 2021-02-02: 25 mg via ORAL
  Filled 2021-02-02 (×3): qty 0.5

## 2021-02-02 NOTE — ED Provider Notes (Signed)
[facility] EMERGENCY DEPARTMENT Provider Note   CSN: [id number] Arrival date & time: 02/02/21  1093     History Chief Complaint  Patient presents with  . Nausea    Tammy [name] is a 30 y.o. female with no significant past medical history presents the ED with complaints of nausea.  On my examination, patient reports that last menses started 12/11/2020.  She has had multiple at home positive pregnancy test, but came to the ED for confirmation.  She states that she has an appoint with [facility] on 02/04/2021 at 3:30 PM.  She came to the ED because she has to walk up a lot of stairs at her base of appointment and is concerned because she also had to handle raw chicken.  She states that she has already spoken with her employer and they have begun to make modifications towards her position that are more considerate of her pregnancy.    This is her first time being pregnant and she is just concerned and uncertain about what to expect.  She understands that a lot of these questions will be answered in two days.  She does endorse intermittent lower abdominal cramping, but no severe pain.  She denies any vaginal bleeding or discharge.  She lives with her boyfriend.  She states that she has been having hot and cold flashes, but denies any objective fevers, cough, shortness of breath, sore throat, or other symptoms.  She has nausea most mornings, but today was the first time that she actually had any emesis.  She had a single episode of nonbloody emesis.  She has a large container of water next to her and is able to eat and drink without difficulty.  HPI     Past Medical History:  Diagnosis Date  . Chlamydia   . Environmental allergies   . Medical history non-contributory     There are no problems to display for this patient.   Past Surgical History:  Procedure Laterality Date  . wisdom teeth abstraction       OB History   No obstetric history on file.     Family  History  Problem Relation Age of Onset  . Hypertension Mother   . Diabetes Maternal Aunt   . Cancer Maternal Uncle   . Cancer Maternal Grandmother     Social History   Tobacco Use  . Smoking status: Former Smoker    Types: Cigarettes  . Smokeless tobacco: Never Used  Vaping Use  . Vaping Use: Never used  Substance Use Topics  . Alcohol use: No  . Drug use: No    Home Medications Prior to Admission medications   Medication Sig Start Date End Date Taking? Authorizing Provider  albuterol (PROVENTIL HFA;VENTOLIN HFA) 108 (90 Base) MCG/ACT inhaler Inhale 1-2 puffs into the lungs every 6 (six) hours as needed for wheezing or shortness of breath.   Yes [provider]  vitamin B-6 (PYRIDOXINE) 25 MG tablet Take 1 tablet (25 mg total) by mouth every 6 (six) hours as needed for up to 30 doses (nausea). 02/02/21  Yes Lorelee New, PA-C  fluconazole (DIFLUCAN) 200 MG tablet Take 1 tablet (200 mg total) by mouth daily. Start In 3-5 days if symptoms don't improve Patient not taking: No sig reported 12/16/19   Mesner, Barbara Cower, MD  ibuprofen (ADVIL,MOTRIN) 800 MG tablet Take 1 tablet (800 mg total) by mouth 3 (three) times daily. Patient not taking: No sig reported 11/25/18   Pauline Aus,  PA-C  magic mouthwash w/lidocaine SOLN Take 5 mLs by mouth 3 (three) times daily as needed for mouth pain. Swish and spit, do not swallow Patient not taking: No sig reported 11/25/18   Triplett, Tammy, PA-C  metroNIDAZOLE (FLAGYL) 500 MG tablet Take 1 tablet (500 mg total) by mouth 2 (two) times daily. Patient not taking: No sig reported 09/06/18   Aviva Kluver B, PA-C  ondansetron (ZOFRAN ODT) 4 MG disintegrating tablet Take 1 tablet (4 mg total) by mouth every 8 (eight) hours as needed for nausea or vomiting. Patient not taking: No sig reported 09/16/17   Samuel Jester, DO  promethazine-dextromethorphan (PROMETHAZINE-DM) 6.25-15 MG/5ML syrup Take 5 mLs by mouth 4 (four) times daily as  needed. Patient not taking: No sig reported 11/25/18   Pauline Aus, PA-C    Allergies    Other, Peach flavor, and Penicillins  Review of Systems   Review of Systems  All other systems reviewed and are negative.   Physical Exam Updated Vital Signs BP 137/87 (BP Location: Left Arm)   Pulse 91   Temp 98.6 F (37 C) (Oral)   Resp 16   Ht 5\' 5"  (1.651 m)   Wt 97.5 kg   LMP 12/14/2020   SpO2 100%   BMI 35.78 kg/m   Physical Exam Vitals and nursing note reviewed. Exam conducted with a chaperone present.  Constitutional:      Appearance: Normal appearance.  HENT:     Head: Normocephalic and atraumatic.  Eyes:     General: No scleral icterus.    Conjunctiva/sclera: Conjunctivae normal.  Pulmonary:     Effort: Pulmonary effort is normal.  Skin:    General: Skin is dry.  Neurological:     Mental Status: She is alert.     GCS: GCS eye subscore is 4. GCS verbal subscore is 5. GCS motor subscore is 6.  Psychiatric:        Mood and Affect: Mood normal.        Behavior: Behavior normal.        Thought Content: Thought content normal.     ED Results / Procedures / Treatments   Labs (all labs ordered are listed, but only abnormal results are displayed) Labs Reviewed  URINALYSIS, ROUTINE W REFLEX MICROSCOPIC - Abnormal; Notable for the following components:      Result Value   APPearance HAZY (*)    Hgb urine dipstick MODERATE (*)    Leukocytes,Ua TRACE (*)    Bacteria, UA RARE (*)    All other components within normal limits  I-STAT BETA HCG BLOOD, ED ([facility], [facility], [facility] ONLY) - Abnormal; Notable for the following components:   I-stat hCG, quantitative >2,000.0 (*)    All other components within normal limits    EKG None  Radiology No results found.  Procedures Procedures   Medications Ordered in ED Medications  pyridOXINE (VITAMIN B-6) tablet 25 mg (25 mg Oral Given 02/02/21 1151)    ED Course  I have reviewed the triage vital signs and the nursing  notes.  Pertinent labs & imaging results that were available during my care of the patient were reviewed by me and considered in my medical decision making (see chart for details).    MDM Rules/Calculators/A&P                          Tammy [name] was evaluated in Emergency Department on 02/02/2021 for the symptoms described in the history  of present illness. She was evaluated in the context of the global COVID-19 pandemic, which necessitated consideration that the patient might be at risk for infection with the SARS-CoV-2 virus that causes COVID-19. Institutional protocols and algorithms that pertain to the evaluation of patients at risk for COVID-19 are in a state of rapid change based on information released by regulatory bodies including the CDC and federal and state organizations. These policies and algorithms were followed during the patient's care in the ED.  I personally reviewed patient's medical chart and all notes from triage and staff during today's encounter. I have also ordered and reviewed all labs and imaging that I felt to be medically necessary in the evaluation of this patient's complaints and with consideration of their physical exam. If needed, translation services were available and utilized.   Based on patient's first day of last menses being reported as December 11, 2020, she is approximately 7 weeks 4 days gestation with EDD of September 17, 2021.  She was treated with pyridoxine 25 mg here in the ED for nausea symptoms.  Well-tolerated, will discharge her home with continued pyridoxine for her morning sickness symptoms.  UA is without evidence of infection.  Patient is reasonable for discharge.  She will follow up with her OB/GYN in 2 days, as scheduled.  ED return precautions discussed.  Patient voices understanding is agreeable to the plan.  Final Clinical Impression(s) / ED Diagnoses Final diagnoses:  Less than [redacted] weeks gestation of pregnancy    Rx / DC Orders ED  Discharge Orders         Ordered    vitamin B-6 (PYRIDOXINE) 25 MG tablet  Every 6 hours PRN        02/02/21 1019           Elvera Maria 02/02/21 1210    Vanetta Mulders, MD 02/10/21 [phone]

## 2021-02-02 NOTE — Discharge Instructions (Signed)
You are indeed pregnant.  The nausea, particular in the morning, as expected in first trimester pregnancy.  I have prescribed you pyridoxine which is first-line treatment for nausea in pregnant patients.  Please take it every 6 hours as needed for nausea symptoms.  Please go to your OB/GYN for your appointment as scheduled in two days.  Please return to the ED or seek immediate medical attention to experience any new or worsening symptoms.

## 2021-02-02 NOTE — ED Triage Notes (Signed)
Pt presents to ED with complaints of nausea x 2 weeks. Pt states she needs a pregnancy test.

## 2021-02-04 ENCOUNTER — Other Ambulatory Visit: Payer: Self-pay

## 2021-02-04 ENCOUNTER — Encounter: Payer: Self-pay | Admitting: Advanced Practice Midwife

## 2021-02-04 ENCOUNTER — Ambulatory Visit (INDEPENDENT_AMBULATORY_CARE_PROVIDER_SITE_OTHER): Payer: BC Managed Care – PPO | Admitting: Advanced Practice Midwife

## 2021-02-04 VITALS — BP 120/83 | HR 86 | Ht 65.0 in | Wt 219.0 lb

## 2021-02-04 DIAGNOSIS — N926 Irregular menstruation, unspecified: Secondary | ICD-10-CM

## 2021-02-04 LAB — POCT URINE PREGNANCY: Preg Test, Ur: POSITIVE — AB

## 2021-02-04 MED ORDER — GOODSENSE PRENATAL VITAMINS 28-0.8 MG PO TABS: 1.0000 | ORAL_TABLET | Freq: Every day | ORAL | 11 refills | Status: DC

## 2021-02-04 MED ORDER — ONDANSETRON 4 MG PO TBDP: 4.0000 mg | ORAL_TABLET | Freq: Three times a day (TID) | ORAL | 1 refills | Status: DC | PRN

## 2021-02-04 NOTE — Progress Notes (Signed)
   GYN VISIT Patient name: Tammy [name] MRN [id number]  Date of birth: 03-08-1991 Chief Complaint:   Possible Pregnancy  History of Present Illness:   Tammy [name] is a 30 y.o. G1P0 African American female being seen today for +UPT. She had an ED visit 02/02/21 for dizziness and nausea/vomiting. Was given B6 to take. Having mild abd cramping; no bleeding.     Depression screen [facility] 2/9 02/04/2021  Decreased Interest 2  Down, Depressed, Hopeless 0  PHQ - 2 Score 2  Altered sleeping 1  Tired, decreased energy 1  Change in appetite 1  Feeling bad or failure about yourself  0  Trouble concentrating 0  Moving slowly or fidgety/restless 0  Suicidal thoughts 0  PHQ-9 Score 5    Patient's last menstrual period was 12/11/2020 (exact date).   Last pap not discussed.  Review of Systems:   Pertinent items are noted in HPI Denies fever/chills, dizziness, headaches, visual disturbances, fatigue, shortness of breath, chest pain, abdominal pain, vomiting, abnormal vaginal discharge/itching/odor/irritation, problems with periods, bowel movements, urination, or intercourse unless otherwise stated above.  Pertinent History Reviewed:  Reviewed past medical,surgical, social, obstetrical and family history.  Reviewed problem list, medications and allergies. Physical Assessment:   Vitals:   02/04/21 1610  BP: 120/83  Pulse: 86  Weight: 219 lb (99.3 kg)  Height: 5\' 5"  (1.651 m)  Body mass index is 36.44 kg/m.       Physical Examination:   General appearance: alert, well appearing, and in no distress  Mental status: alert, oriented to person, place, and time  Skin: warm & dry   Cardiovascular: normal heart rate noted  Respiratory: normal respiratory effort, no distress  Abdomen: soft, non-tender   Pelvic: not indicated  Extremities: no edema    Results for orders placed or performed in visit on 02/04/21 (from the past 24 hour(s))  POCT urine pregnancy   Collection Time: 02/04/21   3:58 PM  Result Value Ref Range   Preg Test, Ur Positive (A) Negative    Assessment & Plan:  1) Early preg> rev'd what to expect  2) N/V of preg> tips given; rx Zofran and PNV  3) Dizziness> try to eat sm amts of protein throughout the day; stay hydrated  Meds:  Meds ordered this encounter  Medications  . ondansetron (ZOFRAN ODT) 4 MG disintegrating tablet    Sig: Take 1 tablet (4 mg total) by mouth every 8 (eight) hours as needed for nausea or vomiting.    Dispense:  30 tablet    Refill:  1    Order Specific Question:   Supervising Provider    Answer:   02/06/21, LUTHER H [[id number]]  . Prenatal Vit-Fe Fumarate-FA (GOODSENSE PRENATAL VITAMINS) 28-0.8 MG TABS    Sig: Take 1 tablet by mouth daily.    Dispense:  30 tablet    Refill:  11    Order Specific Question:   Supervising Provider    Answer:   Despina Hidden H [[id number]]    Orders Placed This Encounter  Procedures  . POCT urine pregnancy    Return for dating Duane Lope first avail; then NOB and NT/IT u/s next month.  [place] CNM 02/04/2021 4:33 PM

## 2021-02-04 NOTE — Patient Instructions (Signed)
The medicine, Zofran, may cause constipation, so you may need to eat more fiber or take a stool softener.  Hyperemesis Gravidarum Hyperemesis gravidarum is a severe form of nausea and vomiting that happens during pregnancy. Hyperemesis is worse than morning sickness. It may cause you to have nausea or vomiting all day for many days. It may keep you from eating and drinking enough food and liquids, which can lead to dehydration, malnutrition, and weight loss. Hyperemesis usually occurs during the first half (the first 20 weeks) of pregnancy. It often goes away once a woman is in her second half of pregnancy. However, sometimes hyperemesis continues through an entire pregnancy. What are the causes? The cause of this condition is not known. It may be associated with:  Changes in hormones in the body during pregnancy.  Changes in the gastrointestinal system.  Genetic or inherited conditions. What are the signs or symptoms? Symptoms of this condition include:  Severe nausea and vomiting that does not go away.  Problems keeping food down.  Weight loss.  Loss of body fluid (dehydration).  Loss of appetite. You may have no desire to eat or you may not like the food you have previously enjoyed. How is this diagnosed? This condition may be diagnosed based on your medical history, your symptoms, and a physical exam. You may also have other tests, including:  Blood tests.  Urine tests.  Blood pressure tests.  Ultrasound to look for problems with the placenta or to check if you are pregnant with more than one baby. How is this treated? This condition is managed by controlling symptoms. This may include:  Following an eating plan. This can help to lessen nausea and vomiting.  Treatments that do not use medicine. These include acupressure bracelets, hypnosis, and eating or drinking foods or fluids that contain ginger, ginger ale, or ginger tea.  Taking prescription medicine or  over-the-counter medicine as told by your health care provider.  Continuing to take prenatal vitamins. You may need to change what kind you take and when you take them. Follow your health care provider's instructions about prenatal vitamins. An eating plan and medicines are often used together to help control symptoms. If medicines do not help relieve nausea and vomiting, you may need to receive fluids through an IV at the hospital. Follow these instructions at home: To help relieve your symptoms, listen to your body. Everyone is different and has different preferences. Find what works best for you. Here are some things you can try to help relieve your symptoms: Meals and snacks  Eat 5-6 small meals daily instead of 3 large meals. Eating small meals and snacks can help you avoid an empty stomach.  Before getting out of bed, eat a couple of crackers to avoid moving around on an empty stomach.  Eat a protein-rich snack before bed. Examples include cheese and crackers, or a peanut butter sandwich made with 1 slice of whole-wheat bread and 1 tsp (5 g) of peanut butter.  Eat and drink slowly.  Try eating starchy foods as these are usually tolerated well. Examples include cereal, toast, bread, potatoes, pasta, rice, and pretzels.  Eat at least one serving of protein with your meals and snacks. Protein options include lean meats, poultry, seafood, beans, nuts, nut butters, eggs, cheese, and yogurt.  Eat or suck on things that have ginger in them. It may help to relieve nausea. Add  tsp (0.44 g) ground ginger to hot tea, or choose ginger tea.   Fluids  It is important to stay hydrated. Try to:  Drink small amounts of fluids often.  Drink fluids 30 minutes before or after a meal to help lessen the feeling of a full stomach.  Drink 100% fruit juice or an electrolyte drink. An electrolyte drink contains sodium, potassium, and chloride.  Drink fluids that are cold, clear, and carbonated or sour.  These include lemonade, ginger ale, lemon-lime soda, ice water, and sparkling water. Things to avoid Avoid the following:  Eating foods that trigger your symptoms. These may include spicy foods, coffee, high-fat foods, very sweet foods, and acidic foods.  Drinking more than 1 cup of fluid at a time.  Skipping meals. Nausea can be more intense on an empty stomach. If you cannot tolerate food, do not force it. Try sucking on ice chips or other frozen items and make up for missed calories later.  Lying down within 2 hours after eating.  Being exposed to environmental triggers. These may include food smells, smoky rooms, closed spaces, rooms with strong smells, warm or humid places, overly loud and noisy rooms, and rooms with motion or flickering lights. Try eating meals in a well-ventilated area that is free of strong smells.  Making quick and sudden changes in your movement.  Taking iron pills and multivitamins that contain iron. If you take prescription iron pills, do not stop taking them unless your health care provider approves.  Preparing food. The smell of food can spoil your appetite or trigger nausea. General instructions  Brush your teeth or use a mouth rinse after meals.  Take over-the-counter and prescription medicines only as told by your health care provider.  Follow instructions from your health care provider about eating or drinking restrictions.  Talk with your health care provider about starting a supplement of vitamin B6.  Continue to take your prenatal vitamins as told by your health care provider. If you are having trouble taking your prenatal vitamins, talk with your health care provider about other options.  Keep all follow-up visits. This is important. Follow-up visits include prenatal visits. Contact a health care provider if:  You have pain in your abdomen.  You have a severe headache.  You have vision problems.  You are losing weight.  You feel weak or  dizzy.  You cannot eat or drink without vomiting, especially if this goes on for a full day. Get help right away if:  You cannot drink fluids without vomiting.  You vomit blood.  You have constant nausea and vomiting.  You are very weak.  You faint.  You have a fever and your symptoms suddenly get worse. Summary  Hyperemesis gravidarum is a severe form of nausea and vomiting that happens during pregnancy.  Making some changes to your eating habits may help relieve nausea and vomiting.  This condition may be managed with lifestyle changes and medicines as prescribed by your health care provider.  If medicines do not help relieve nausea and vomiting, you may need to receive fluids through an IV at the hospital. This information is not intended to replace advice given to you by your health care provider. Make sure you discuss any questions you have with your health care provider. Document Revised: 06/02/2020 Document Reviewed: 06/02/2020 Elsevier Patient Education  2021 [employer].

## 2021-02-09 NOTE — [facility] Program (Signed)
Blood pressure screenings by [employer] [facility] program at [employer] during [employer] pantry hours. Patient reports she is pregnant and is receiving care and primary care at [facility].  Blood pressure: 118/80 left arm, sitting pulse 86.  Encouraged to continue prenatal care.  Francee Nodal RN [facility]

## 2021-02-10 ENCOUNTER — Encounter: Payer: Self-pay | Admitting: *Deleted

## 2021-02-16 ENCOUNTER — Other Ambulatory Visit: Payer: Self-pay | Admitting: Obstetrics & Gynecology

## 2021-02-16 DIAGNOSIS — O3680X0 Pregnancy with inconclusive fetal viability, not applicable or unspecified: Secondary | ICD-10-CM

## 2021-02-17 ENCOUNTER — Other Ambulatory Visit: Payer: Self-pay

## 2021-02-17 ENCOUNTER — Ambulatory Visit (INDEPENDENT_AMBULATORY_CARE_PROVIDER_SITE_OTHER): Payer: BC Managed Care – PPO

## 2021-02-17 ENCOUNTER — Other Ambulatory Visit: Payer: Self-pay | Admitting: Obstetrics & Gynecology

## 2021-02-17 DIAGNOSIS — O3680X0 Pregnancy with inconclusive fetal viability, not applicable or unspecified: Secondary | ICD-10-CM | POA: Diagnosis not present

## 2021-02-17 DIAGNOSIS — Z3A09 9 weeks gestation of pregnancy: Secondary | ICD-10-CM

## 2021-02-17 NOTE — Progress Notes (Signed)
US TV:8+5 wks,single IUP with YS,positive fht 169 bpm,normal ovaries,crl 21.36 mm

## 2021-02-26 ENCOUNTER — Other Ambulatory Visit: Payer: BC Managed Care – PPO

## 2021-03-13 ENCOUNTER — Other Ambulatory Visit: Payer: Self-pay | Admitting: Obstetrics & Gynecology

## 2021-03-13 DIAGNOSIS — Z3682 Encounter for antenatal screening for nuchal translucency: Secondary | ICD-10-CM

## 2021-03-16 ENCOUNTER — Ambulatory Visit: Payer: BC Managed Care – PPO | Admitting: *Deleted

## 2021-03-16 ENCOUNTER — Other Ambulatory Visit: Payer: Self-pay

## 2021-03-16 ENCOUNTER — Ambulatory Visit (INDEPENDENT_AMBULATORY_CARE_PROVIDER_SITE_OTHER): Payer: BC Managed Care – PPO

## 2021-03-16 ENCOUNTER — Ambulatory Visit (INDEPENDENT_AMBULATORY_CARE_PROVIDER_SITE_OTHER): Payer: BC Managed Care – PPO | Admitting: Women's Health

## 2021-03-16 ENCOUNTER — Encounter: Payer: Self-pay | Admitting: Women's Health

## 2021-03-16 VITALS — BP 133/77 | HR 89 | Wt 220.0 lb

## 2021-03-16 DIAGNOSIS — Z3402 Encounter for supervision of normal first pregnancy, second trimester: Secondary | ICD-10-CM

## 2021-03-16 DIAGNOSIS — Z3682 Encounter for antenatal screening for nuchal translucency: Secondary | ICD-10-CM

## 2021-03-16 DIAGNOSIS — Z3401 Encounter for supervision of normal first pregnancy, first trimester: Secondary | ICD-10-CM

## 2021-03-16 DIAGNOSIS — Z3A12 12 weeks gestation of pregnancy: Secondary | ICD-10-CM

## 2021-03-16 DIAGNOSIS — Z3400 Encounter for supervision of normal first pregnancy, unspecified trimester: Secondary | ICD-10-CM | POA: Insufficient documentation

## 2021-03-16 LAB — POCT URINALYSIS DIPSTICK OB
Glucose, UA: NEGATIVE
Leukocytes, UA: NEGATIVE
Nitrite, UA: NEGATIVE
POC,PROTEIN,UA: NEGATIVE

## 2021-03-16 MED ORDER — ASPIRIN 81 MG PO TBEC
81.0000 mg | DELAYED_RELEASE_TABLET | Freq: Every day | ORAL | 3 refills | Status: DC
Start: 2021-03-16 — End: 2021-09-26

## 2021-03-16 NOTE — Patient Instructions (Signed)
Tammy [name], I greatly value your feedback.  If you receive a survey following your visit with [place] today, we appreciate you taking the time to fill it out.  Thanks, [name], CNM, WHNP-BC   [facility] ([address] [place], [place] [address]) Entrance C, located off of [address] 24/7 valet parking   Nausea & Vomiting  Have saltine crackers or pretzels by your bed and eat a few bites before you raise your head out of bed in the morning  Eat small frequent meals throughout the day instead of large meals  Drink plenty of fluids throughout the day to stay hydrated, just don't drink a lot of fluids with your meals.  This can make your stomach fill up faster making you feel sick  Do not brush your teeth right after you eat  Products with real ginger are good for nausea, like ginger ale and ginger hard candy Make sure it says made with real ginger!  Sucking on sour candy like lemon heads is also good for nausea  If your prenatal vitamins make you nauseated, take them at night so you will sleep through the nausea  Sea Bands  If you feel like you need medicine for the nausea & vomiting please let us know  If you are unable to keep any fluids or food down please let us know   Constipation  Drink plenty of fluid, preferably water, throughout the day  Eat foods high in fiber such as fruits, vegetables, and grains  Exercise, such as walking, is a good way to keep your bowels regular  Drink warm fluids, especially warm prune juice, or decaf coffee  Eat a 1/2 cup of real oatmeal (not instant), 1/2 cup applesauce, and 1/2-1 cup warm prune juice every day  If needed, you may take Colace (docusate sodium) stool softener once or twice a day to help keep the stool soft.   If you still are having problems with constipation, you may take Miralax once daily as needed to help keep your bowels regular.   Home Blood Pressure Monitoring for Patients    Your provider has recommended that you check your blood pressure (BP) at least once a week at home. If you do not have a blood pressure cuff at home, one will be provided for you. Contact your provider if you have not received your monitor within 1 week.   Helpful Tips for Accurate Home Blood Pressure Checks  . Don't smoke, exercise, or drink caffeine 30 minutes before checking your BP . Use the restroom before checking your BP (a full bladder can raise your pressure) . Relax in a comfortable upright chair . Feet on the ground . Left arm resting comfortably on a flat surface at the level of your heart . Legs uncrossed . Back supported . Sit quietly and don't talk . Place the cuff on your bare arm . Adjust snuggly, so that only two fingertips can fit between your skin and the top of the cuff . Check 2 readings separated by at least one minute . Keep a log of your BP readings . For a visual, please reference this diagram: http://ccnc.care/bpdiagram  Provider Name: [facility]     Phone: [phone]  Zone 1: ALL CLEAR  Continue to monitor your symptoms:  . BP reading is less than 140 (top number) or less than 90 (bottom number)  . No right upper stomach pain . No headaches or  seeing spots . No feeling nauseated or throwing up . No swelling in face and hands  Zone 2: CAUTION Call your doctor's office for any of the following:  . BP reading is greater than 140 (top number) or greater than 90 (bottom number)  . Stomach pain under your ribs in the middle or right side . Headaches or seeing spots . Feeling nauseated or throwing up . Swelling in face and hands  Zone 3: EMERGENCY  Seek immediate medical care if you have any of the following:  . BP reading is greater than160 (top number) or greater than 110 (bottom number) . Severe headaches not improving with Tylenol . Serious difficulty catching your breath . Any worsening symptoms from Zone 2    First Trimester of  Pregnancy The first trimester of pregnancy is from week 1 until the end of week 12 (months 1 through 3). A week after a sperm fertilizes an egg, the egg will implant on the wall of the uterus. This embryo will begin to develop into a baby. Genes from you and your partner are forming the baby. The female genes determine whether the baby is a boy or a girl. At 6-8 weeks, the eyes and face are formed, and the heartbeat can be seen on ultrasound. At the end of 12 weeks, all the baby's organs are formed.  Now that you are pregnant, you will want to do everything you can to have a healthy baby. Two of the most important things are to get good prenatal care and to follow your health care provider's instructions. Prenatal care is all the medical care you receive before the baby's birth. This care will help prevent, find, and treat any problems during the pregnancy and childbirth. BODY CHANGES Your body goes through many changes during pregnancy. The changes vary from woman to woman.   You may gain or lose a couple of pounds at first.  You may feel sick to your stomach (nauseous) and throw up (vomit). If the vomiting is uncontrollable, call your health care provider.  You may tire easily.  You may develop headaches that can be relieved by medicines approved by your health care provider.  You may urinate more often. Painful urination may mean you have a bladder infection.  You may develop heartburn as a result of your pregnancy.  You may develop constipation because certain hormones are causing the muscles that push waste through your intestines to slow down.  You may develop hemorrhoids or swollen, bulging veins (varicose veins).  Your breasts may begin to grow larger and become tender. Your nipples may stick out more, and the tissue that surrounds them (areola) may become darker.  Your gums may bleed and may be sensitive to brushing and flossing.  Dark spots or blotches (chloasma, mask of pregnancy)  may develop on your face. This will likely fade after the baby is born.  Your menstrual periods will stop.  You may have a loss of appetite.  You may develop cravings for certain kinds of food.  You may have changes in your emotions from day to day, such as being excited to be pregnant or being concerned that something may go wrong with the pregnancy and baby.  You may have more vivid and strange dreams.  You may have changes in your hair. These can include thickening of your hair, rapid growth, and changes in texture. Some women also have hair loss during or after pregnancy, or hair that feels dry or thin. Your  hair will most likely return to normal after your baby is born. WHAT TO EXPECT AT YOUR PRENATAL VISITS During a routine prenatal visit:  You will be weighed to make sure you and the baby are growing normally.  Your blood pressure will be taken.  Your abdomen will be measured to track your baby's growth.  The fetal heartbeat will be listened to starting around week 10 or 12 of your pregnancy.  Test results from any previous visits will be discussed. Your health care provider may ask you:  How you are feeling.  If you are feeling the baby move.  If you have had any abnormal symptoms, such as leaking fluid, bleeding, severe headaches, or abdominal cramping.  If you have any questions. Other tests that may be performed during your first trimester include:  Blood tests to find your blood type and to check for the presence of any previous infections. They will also be used to check for low iron levels (anemia) and Rh antibodies. Later in the pregnancy, blood tests for diabetes will be done along with other tests if problems develop.  Urine tests to check for infections, diabetes, or protein in the urine.  An ultrasound to confirm the proper growth and development of the baby.  An amniocentesis to check for possible genetic problems.  Fetal screens for spina bifida and  Down syndrome.  You may need other tests to make sure you and the baby are doing well. HOME CARE INSTRUCTIONS  Medicines  Follow your health care provider's instructions regarding medicine use. Specific medicines may be either safe or unsafe to take during pregnancy.  Take your prenatal vitamins as directed.  If you develop constipation, try taking a stool softener if your health care provider approves. Diet  Eat regular, well-balanced meals. Choose a variety of foods, such as meat or vegetable-based protein, fish, milk and low-fat dairy products, vegetables, fruits, and whole grain breads and cereals. Your health care provider will help you determine the amount of weight gain that is right for you.  Avoid raw meat and uncooked cheese. These carry germs that can cause birth defects in the baby.  Eating four or five small meals rather than three large meals a day may help relieve nausea and vomiting. If you start to feel nauseous, eating a few soda crackers can be helpful. Drinking liquids between meals instead of during meals also seems to help nausea and vomiting.  If you develop constipation, eat more high-fiber foods, such as fresh vegetables or fruit and whole grains. Drink enough fluids to keep your urine clear or pale yellow. Activity and Exercise  Exercise only as directed by your health care provider. Exercising will help you:  Control your weight.  Stay in shape.  Be prepared for labor and delivery.  Experiencing pain or cramping in the lower abdomen or low back is a good sign that you should stop exercising. Check with your health care provider before continuing normal exercises.  Try to avoid standing for long periods of time. Move your legs often if you must stand in one place for a long time.  Avoid heavy lifting.  Wear low-heeled shoes, and practice good posture.  You may continue to have sex unless your health care provider directs you otherwise. Relief of Pain  or Discomfort  Wear a good support bra for breast tenderness.    Take warm sitz baths to soothe any pain or discomfort caused by hemorrhoids. Use hemorrhoid cream if your health care provider  approves.    Rest with your legs elevated if you have leg cramps or low back pain.  If you develop varicose veins in your legs, wear support hose. Elevate your feet for 15 minutes, 3-4 times a day. Limit salt in your diet. Prenatal Care  Schedule your prenatal visits by the twelfth week of pregnancy. They are usually scheduled monthly at first, then more often in the last 2 months before delivery.  Write down your questions. Take them to your prenatal visits.  Keep all your prenatal visits as directed by your health care provider. Safety  Wear your seat belt at all times when driving.  Make a list of emergency phone numbers, including numbers for family, friends, the hospital, and police and fire departments. General Tips  Ask your health care provider for a referral to a local prenatal education class. Begin classes no later than at the beginning of month 6 of your pregnancy.  Ask for help if you have counseling or nutritional needs during pregnancy. Your health care provider can offer advice or refer you to specialists for help with various needs.  Do not use hot tubs, steam rooms, or saunas.  Do not douche or use tampons or scented sanitary pads.  Do not cross your legs for long periods of time.  Avoid cat litter boxes and soil used by cats. These carry germs that can cause birth defects in the baby and possibly loss of the fetus by miscarriage or stillbirth.  Avoid all smoking, herbs, alcohol, and medicines not prescribed by your health care provider. Chemicals in these affect the formation and growth of the baby.  Schedule a dentist appointment. At home, brush your teeth with a soft toothbrush and be gentle when you floss. SEEK MEDICAL CARE IF:   You have dizziness.  You have mild  pelvic cramps, pelvic pressure, or nagging pain in the abdominal area.  You have persistent nausea, vomiting, or diarrhea.  You have a bad smelling vaginal discharge.  You have pain with urination.  You notice increased swelling in your face, hands, legs, or ankles. SEEK IMMEDIATE MEDICAL CARE IF:   You have a fever.  You are leaking fluid from your vagina.  You have spotting or bleeding from your vagina.  You have severe abdominal cramping or pain.  You have rapid weight gain or loss.  You vomit blood or material that looks like coffee grounds.  You are exposed to [place] measles and have never had them.  You are exposed to fifth disease or chickenpox.  You develop a severe headache.  You have shortness of breath.  You have any kind of trauma, such as from a fall or a car accident. Document Released: 11/02/2001 Document Revised: 03/25/2014 Document Reviewed: 09/18/2013 [facility] Patient Information 2015 [place], [place]. This information is not intended to replace advice given to you by your health care provider. Make sure you discuss any questions you have with your health care provider.

## 2021-03-16 NOTE — Progress Notes (Signed)
[place] 12+4 wks,measurements c/w dates,CRL 57.31 mm,NB present,NT 1.2 mm,normal ovaries,fhr 165 bpm

## 2021-03-16 NOTE — Progress Notes (Signed)
INITIAL OBSTETRICAL VISIT Patient name: Tammy [name] MRN [id number]  Date of birth: 1991-05-08 Chief Complaint:   Initial Prenatal Visit (Nt/it)  History of Present Illness:   Tammy [name] is a 30 y.o. G1P0 African American female at [redacted]w[redacted]d by [place] at 8 weeks with an Estimated Date of Delivery: 09/24/21 being seen today for her initial obstetrical visit.   Her obstetrical history is significant for primigravida.  Previous smoker, stopped w/ +PT Today she reports no complaints.  Depression screen [facility] 2/9 03/16/2021 02/04/2021  Decreased Interest 1 2  Down, Depressed, Hopeless 0 0  PHQ - 2 Score 1 2  Altered sleeping 0 1  Tired, decreased energy 0 1  Change in appetite 0 1  Feeling bad or failure about yourself  0 0  Trouble concentrating 0 0  Moving slowly or fidgety/restless 1 0  Suicidal thoughts 0 0  PHQ-9 Score 2 5    Patient's last menstrual period was 12/11/2020 (approximate). Last pap Jan 2022 . Results were: negative per pt report at [facility] Review of Systems:   Pertinent items are noted in HPI Denies cramping/contractions, leakage of fluid, vaginal bleeding, abnormal vaginal discharge w/ itching/odor/irritation, headaches, visual changes, shortness of breath, chest pain, abdominal pain, severe nausea/vomiting, or problems with urination or bowel movements unless otherwise stated above.  Pertinent History Reviewed:  Reviewed past medical,surgical, social, obstetrical and family history.  Reviewed problem list, medications and allergies. OB History  Gravida Para Term Preterm AB Living  1            SAB IAB Ectopic Multiple Live Births               # Outcome Date GA Lbr Len/2nd Weight Sex Delivery Anes PTL Lv  1 Current            Physical Assessment:   Vitals:   03/16/21 1046  BP: 133/77  Pulse: 89  Weight: 220 lb (99.8 kg)  Body mass index is 36.61 kg/m.       Physical Examination:  General appearance - well appearing, and in no distress  Mental status -  alert, oriented to person, place, and time  Psych:  She has a normal mood and affect  Skin - warm and dry, normal color, no suspicious lesions noted  Chest - effort normal, all lung fields clear to auscultation bilaterally  Heart - normal rate and regular rhythm  Abdomen - soft, nontender  Extremities:  No swelling or varicosities noted  Thin prep pap is not done   Chaperone: N/A    TODAY'S NT  [place] 12+4 wks,measurements c/w dates,CRL 57.31 mm,NB present,NT 1.2 mm,normal ovaries,fhr 165 bpm  No results found for this or any previous visit (from the past 24 hour(s)).  Assessment & Plan:  1) Low-Risk Pregnancy G1P0 at [redacted]w[redacted]d with an Estimated Date of Delivery: 09/24/21   2) Initial OB visit  Meds:  Meds ordered this encounter  Medications  . aspirin 81 MG EC tablet    Sig: Take 1 tablet (81 mg total) by mouth daily. Swallow whole.    Dispense:  90 tablet    Refill:  3    Order Specific Question:   Supervising Provider    Answer:   Duane Lope H [[id number]]    Initial labs obtained Continue prenatal vitamins Reviewed n/v relief measures and warning s/s to report Reviewed recommended weight gain based on pre-gravid BMI Encouraged well-balanced diet Genetic & carrier screening discussed: requests Panorama, NT/IT and Horizon  14  Ultrasound discussed; fetal survey: requested CCNC completed> form faxed if has or is planning to apply for medicaid The nature of [facility] [employer] with multiple MDs and other Advanced Practice Providers was explained to patient; also emphasized that fellows, residents, and students are part of our team. Does not have home bp cuff. Office bp cuff given: yes. Check bp weekly, let us know if consistently >140/90.   Indications for ASA therapy (per uptodate)  OR Two or more of the following: Nulliparity Yes Obesity (BMI>30 kg/m2) Yes Sociodemographic characteristics (African American race, low socioeconomic level) Yes   Follow-up:  Return in about 3 weeks (around 04/06/2021) for LROB, in person, CNM; get [facility] from [facility] please.   Orders Placed This Encounter  Procedures  . Urine Culture  . GC/Chlamydia Probe Amp  . Integrated 1  . Genetic Screening  . Pain Management Screening Profile (10S)  . CBC/D/Plt+RPR+Rh+ABO+Rub Ab...  . POC Urinalysis Dipstick OB    Cheral Marker CNM, [facility] 03/16/2021 12:03 PM

## 2021-03-17 LAB — PMP SCREEN PROFILE (10S), URINE
Amphetamine Scrn, Ur: NEGATIVE ng/mL
BARBITURATE SCREEN URINE: NEGATIVE ng/mL
BENZODIAZEPINE SCREEN, URINE: NEGATIVE ng/mL
CANNABINOIDS UR QL SCN: POSITIVE ng/mL — AB
Cocaine (Metab) Scrn, Ur: NEGATIVE ng/mL
Creatinine(Crt), U: 249.8 mg/dL (ref 20.0–300.0)
Methadone Screen, Urine: NEGATIVE ng/mL
OXYCODONE+OXYMORPHONE UR QL SCN: NEGATIVE ng/mL
Opiate Scrn, Ur: NEGATIVE ng/mL
Ph of Urine: 6.1 (ref 4.5–8.9)
Phencyclidine Qn, Ur: NEGATIVE ng/mL
Propoxyphene Scrn, Ur: NEGATIVE ng/mL

## 2021-03-18 ENCOUNTER — Encounter: Payer: Self-pay | Admitting: Women's Health

## 2021-03-18 DIAGNOSIS — F1290 Cannabis use, unspecified, uncomplicated: Secondary | ICD-10-CM | POA: Insufficient documentation

## 2021-03-18 LAB — INTEGRATED 1
Crown Rump Length: 57.3 mm
Gest. Age on Collection Date: 12.1 weeks
Maternal Age at EDD: 28.7 yr
Nuchal Translucency (NT): 1.2 mm
Number of Fetuses: 1
PAPP-A Value: 1275.4 ng/mL
Weight: 220 [lb_av]

## 2021-03-18 LAB — CBC/D/PLT+RPR+RH+ABO+RUB AB...
Antibody Screen: NEGATIVE
Basophils Absolute: 0.1 10*3/uL (ref 0.0–0.2)
Basos: 0 %
EOS (ABSOLUTE): 0.1 10*3/uL (ref 0.0–0.4)
Eos: 0 %
HCV Ab: <0.1 s/co ratio (ref 0.0–0.9)
HIV Screen 4th Generation wRfx: NONREACTIVE
Hematocrit: 38.3 % (ref 34.0–46.6)
Hemoglobin: 12.3 g/dL (ref 11.1–15.9)
Hepatitis B Surface Ag: NEGATIVE
Immature Grans (Abs): 0 10*3/uL (ref 0.0–0.1)
Immature Granulocytes: 0 %
Lymphocytes Absolute: 2.5 10*3/uL (ref 0.7–3.1)
Lymphs: 19 %
MCH: 30.8 pg (ref 26.6–33.0)
MCHC: 32.1 g/dL (ref 31.5–35.7)
MCV: 96 fL (ref 79–97)
Monocytes Absolute: 0.9 10*3/uL (ref 0.1–0.9)
Monocytes: 7 %
Neutrophils Absolute: 9.8 10*3/uL — ABNORMAL HIGH (ref 1.4–7.0)
Neutrophils: 74 %
Platelets: 387 10*3/uL (ref 150–450)
RBC: 3.99 x10E6/uL (ref 3.77–5.28)
RDW: 12.5 % (ref 11.7–15.4)
RPR Ser Ql: NONREACTIVE
Rh Factor: POSITIVE
Rubella Antibodies, IGG: 4.06 index (ref >0.99)
WBC: 13.5 10*3/uL — ABNORMAL HIGH (ref 3.4–10.8)

## 2021-03-18 LAB — GC/CHLAMYDIA PROBE AMP
Chlamydia trachomatis, NAA: NEGATIVE
Neisseria Gonorrhoeae by PCR: NEGATIVE

## 2021-03-18 LAB — HCV INTERPRETATION

## 2021-03-19 LAB — URINE CULTURE

## 2021-04-07 ENCOUNTER — Encounter: Payer: Self-pay | Admitting: Women's Health

## 2021-04-07 ENCOUNTER — Ambulatory Visit (INDEPENDENT_AMBULATORY_CARE_PROVIDER_SITE_OTHER): Payer: BC Managed Care – PPO | Admitting: Women's Health

## 2021-04-07 ENCOUNTER — Other Ambulatory Visit: Payer: Self-pay

## 2021-04-07 VITALS — BP 119/76 | HR 95 | Wt 223.8 lb

## 2021-04-07 DIAGNOSIS — R319 Hematuria, unspecified: Secondary | ICD-10-CM

## 2021-04-07 DIAGNOSIS — Z1379 Encounter for other screening for genetic and chromosomal anomalies: Secondary | ICD-10-CM

## 2021-04-07 DIAGNOSIS — Z1389 Encounter for screening for other disorder: Secondary | ICD-10-CM

## 2021-04-07 DIAGNOSIS — F1290 Cannabis use, unspecified, uncomplicated: Secondary | ICD-10-CM

## 2021-04-07 DIAGNOSIS — Z331 Pregnant state, incidental: Secondary | ICD-10-CM

## 2021-04-07 DIAGNOSIS — Z3402 Encounter for supervision of normal first pregnancy, second trimester: Secondary | ICD-10-CM

## 2021-04-07 DIAGNOSIS — Z363 Encounter for antenatal screening for malformations: Secondary | ICD-10-CM

## 2021-04-07 LAB — POCT URINALYSIS DIPSTICK OB
Blood, UA: 3
Glucose, UA: NEGATIVE
Ketones, UA: NEGATIVE
Leukocytes, UA: NEGATIVE
Nitrite, UA: NEGATIVE

## 2021-04-07 NOTE — Patient Instructions (Signed)
Tammy [name], I greatly value your feedback.  If you receive a survey following your visit with [place] today, we appreciate you taking the time to fill it out.  Thanks, [name], CNM, WHNP-BC  [facility] ([address] [place], [place] [address]) Entrance C, located off of [address] valet parking  Go to [email or web address] to register for FREE online childbirth classes  [place] Pediatricians/Family Doctors:  [facility] [phone]            [facility] [phone]                 [facility] [phone] (usually not accepting new patients unless you have family there already, you are always welcome to call and ask)       [facility] Department [phone]       [facility] Pediatricians/Family Doctors:   [facility]: [phone]  [facility]: [phone]  [facility]: [phone]  [facility] Doctors:   [facility]: [phone]   [facility]: [phone]  [facility] Doctors:  [facility]: [phone]    Home Blood Pressure Monitoring for Patients   Your provider has recommended that you check your blood pressure (BP) at least once a week at home. If you do not have a blood pressure cuff at home, one will be provided for you. Contact your provider if you have not received your monitor within 1 week.   Helpful Tips for Accurate Home Blood Pressure Checks  . Don't smoke, exercise, or drink caffeine 30 minutes before checking your BP . Use the restroom before checking your BP (a full bladder can raise your pressure) . Relax in a comfortable upright chair . Feet on the ground . Left arm resting comfortably on a flat surface at the level of your heart . Legs uncrossed . Back supported . Sit quietly and don't talk . Place the cuff on your bare arm . Adjust snuggly, so  that only two fingertips can fit between your skin and the top of the cuff . Check 2 readings separated by at least one minute . Keep a log of your BP readings . For a visual, please reference this diagram: http://ccnc.care/bpdiagram  Provider Name: [facility]     Phone: [phone]  Zone 1: ALL CLEAR  Continue to monitor your symptoms:  . BP reading is less than 140 (top number) or less than 90 (bottom number)  . No right upper stomach pain . No headaches or seeing spots . No feeling nauseated or throwing up . No swelling in face and hands  Zone 2: CAUTION Call your doctor's office for any of the following:  . BP reading is greater than 140 (top number) or greater than 90 (bottom number)  . Stomach pain under your ribs in the middle or right side . Headaches or seeing spots . Feeling nauseated or throwing up . Swelling in face and hands  Zone 3: EMERGENCY  Seek immediate medical care if you have any of the following:  . BP reading is greater than160 (top number) or greater than 110 (bottom number) . Severe headaches not improving with Tylenol . Serious difficulty catching your breath . Any worsening symptoms from Zone 2     Second Trimester of Pregnancy The second trimester is from week 14 through week 27 (months 4 through 6). The second trimester is often a time when you feel your  best. Your body has adjusted to being pregnant, and you begin to feel better physically. Usually, morning sickness has lessened or quit completely, you may have more energy, and you may have an increase in appetite. The second trimester is also a time when the fetus is growing rapidly. At the end of the sixth month, the fetus is about 9 inches long and weighs about 1 pounds. You will likely begin to feel the baby move (quickening) between 16 and 20 weeks of pregnancy. Body changes during your second trimester Your body continues to go through many changes during your second trimester. The  changes vary from woman to woman.  Your weight will continue to increase. You will notice your lower abdomen bulging out.  You may begin to get stretch marks on your hips, abdomen, and breasts.  You may develop headaches that can be relieved by medicines. The medicines should be approved by your health care provider.  You may urinate more often because the fetus is pressing on your bladder.  You may develop or continue to have heartburn as a result of your pregnancy.  You may develop constipation because certain hormones are causing the muscles that push waste through your intestines to slow down.  You may develop hemorrhoids or swollen, bulging veins (varicose veins).  You may have back pain. This is caused by: ? Weight gain. ? Pregnancy hormones that are relaxing the joints in your pelvis. ? A shift in weight and the muscles that support your balance.  Your breasts will continue to grow and they will continue to become tender.  Your gums may bleed and may be sensitive to brushing and flossing.  Dark spots or blotches (chloasma, mask of pregnancy) may develop on your face. This will likely fade after the baby is born.  A dark line from your belly button to the pubic area (linea nigra) may appear. This will likely fade after the baby is born.  You may have changes in your hair. These can include thickening of your hair, rapid growth, and changes in texture. Some women also have hair loss during or after pregnancy, or hair that feels dry or thin. Your hair will most likely return to normal after your baby is born.  What to expect at prenatal visits During a routine prenatal visit:  You will be weighed to make sure you and the fetus are growing normally.  Your blood pressure will be taken.  Your abdomen will be measured to track your baby's growth.  The fetal heartbeat will be listened to.  Any test results from the previous visit will be discussed.  Your health care  provider may ask you:  How you are feeling.  If you are feeling the baby move.  If you have had any abnormal symptoms, such as leaking fluid, bleeding, severe headaches, or abdominal cramping.  If you are using any tobacco products, including cigarettes, chewing tobacco, and electronic cigarettes.  If you have any questions.  Other tests that may be performed during your second trimester include:  Blood tests that check for: ? Low iron levels (anemia). ? High blood sugar that affects pregnant women (gestational diabetes) between 71 and 28 weeks. ? Rh antibodies. This is to check for a protein on red blood cells (Rh factor).  Urine tests to check for infections, diabetes, or protein in the urine.  An ultrasound to confirm the proper growth and development of the baby.  An amniocentesis to check for possible genetic problems.  Fetal  screens for spina bifida and Down syndrome.  HIV (human immunodeficiency virus) testing. Routine prenatal testing includes screening for HIV, unless you choose not to have this test.  Follow these instructions at home: Medicines  Follow your health care provider's instructions regarding medicine use. Specific medicines may be either safe or unsafe to take during pregnancy.  Take a prenatal vitamin that contains at least 600 micrograms (mcg) of folic acid.  If you develop constipation, try taking a stool softener if your health care provider approves. Eating and drinking  Eat a balanced diet that includes fresh fruits and vegetables, whole grains, good sources of protein such as meat, eggs, or tofu, and low-fat dairy. Your health care provider will help you determine the amount of weight gain that is right for you.  Avoid raw meat and uncooked cheese. These carry germs that can cause birth defects in the baby.  If you have low calcium intake from food, talk to your health care provider about whether you should take a daily calcium  supplement.  Limit foods that are high in fat and processed sugars, such as fried and sweet foods.  To prevent constipation: ? Drink enough fluid to keep your urine clear or pale yellow. ? Eat foods that are high in fiber, such as fresh fruits and vegetables, whole grains, and beans. Activity  Exercise only as directed by your health care provider. Most women can continue their usual exercise routine during pregnancy. Try to exercise for 30 minutes at least 5 days a week. Stop exercising if you experience uterine contractions.  Avoid heavy lifting, wear low heel shoes, and practice good posture.  A sexual relationship may be continued unless your health care provider directs you otherwise. Relieving pain and discomfort  Wear a good support bra to prevent discomfort from breast tenderness.  Take warm sitz baths to soothe any pain or discomfort caused by hemorrhoids. Use hemorrhoid cream if your health care provider approves.  Rest with your legs elevated if you have leg cramps or low back pain.  If you develop varicose veins, wear support hose. Elevate your feet for 15 minutes, 3-4 times a day. Limit salt in your diet. Prenatal Care  Write down your questions. Take them to your prenatal visits.  Keep all your prenatal visits as told by your health care provider. This is important. Safety  Wear your seat belt at all times when driving.  Make a list of emergency phone numbers, including numbers for family, friends, the hospital, and police and fire departments. General instructions  Ask your health care provider for a referral to a local prenatal education class. Begin classes no later than the beginning of month 6 of your pregnancy.  Ask for help if you have counseling or nutritional needs during pregnancy. Your health care provider can offer advice or refer you to specialists for help with various needs.  Do not use hot tubs, steam rooms, or saunas.  Do not douche or use  tampons or scented sanitary pads.  Do not cross your legs for long periods of time.  Avoid cat litter boxes and soil used by cats. These carry germs that can cause birth defects in the baby and possibly loss of the fetus by miscarriage or stillbirth.  Avoid all smoking, herbs, alcohol, and unprescribed drugs. Chemicals in these products can affect the formation and growth of the baby.  Do not use any products that contain nicotine or tobacco, such as cigarettes and e-cigarettes. If you need help  quitting, ask your health care provider.  Visit your dentist if you have not gone yet during your pregnancy. Use a soft toothbrush to brush your teeth and be gentle when you floss. Contact a health care provider if:  You have dizziness.  You have mild pelvic cramps, pelvic pressure, or nagging pain in the abdominal area.  You have persistent nausea, vomiting, or diarrhea.  You have a bad smelling vaginal discharge.  You have pain when you urinate. Get help right away if:  You have a fever.  You are leaking fluid from your vagina.  You have spotting or bleeding from your vagina.  You have severe abdominal cramping or pain.  You have rapid weight gain or weight loss.  You have shortness of breath with chest pain.  You notice sudden or extreme swelling of your face, hands, ankles, feet, or legs.  You have not felt your baby move in over an hour.  You have severe headaches that do not go away when you take medicine.  You have vision changes. Summary  The second trimester is from week 14 through week 27 (months 4 through 6). It is also a time when the fetus is growing rapidly.  Your body goes through many changes during pregnancy. The changes vary from woman to woman.  Avoid all smoking, herbs, alcohol, and unprescribed drugs. These chemicals affect the formation and growth your baby.  Do not use any tobacco products, such as cigarettes, chewing tobacco, and e-cigarettes. If you  need help quitting, ask your health care provider.  Contact your health care provider if you have any questions. Keep all prenatal visits as told by your health care provider. This is important. This information is not intended to replace advice given to you by your health care provider. Make sure you discuss any questions you have with your health care provider. Document Released: 11/02/2001 Document Revised: 04/15/2016 Document Reviewed: 01/09/2013 Elsevier Interactive Patient Education  2017 Elsevier Inc.   

## 2021-04-07 NOTE — Progress Notes (Signed)
LOW-RISK PREGNANCY VISIT Patient name: Tammy [name] MRN [id number]  Date of birth: 1990-12-14 Chief Complaint:   Routine Prenatal Visit (2nd IT)  History of Present Illness:   Tammy [name] is a 30 y.o. G1P0 female at [redacted]w[redacted]d with an Estimated Date of Delivery: 09/24/21 being seen today for ongoing management of a low-risk pregnancy.  Depression screen [facility] 2/9 03/16/2021 02/04/2021  Decreased Interest 1 2  Down, Depressed, Hopeless 0 0  PHQ - 2 Score 1 2  Altered sleeping 0 1  Tired, decreased energy 0 1  Change in appetite 0 1  Feeling bad or failure about yourself  0 0  Trouble concentrating 0 0  Moving slowly or fidgety/restless 1 0  Suicidal thoughts 0 0  PHQ-9 Score 2 5    Today she reports no complaints. 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 Denies S/S UTI. Hasn't start baby ASA, didn't have money to get from pharmacy, discussed can get from [facility]. Contractions: Not present.  .  Movement: Absent. denies leaking of fluid. Review of Systems:   Pertinent items are noted in HPI Denies abnormal vaginal discharge w/ itching/odor/irritation, headaches, visual changes, shortness of breath, chest pain, abdominal pain, severe nausea/vomiting, or problems with urination or bowel movements unless otherwise stated above. Pertinent History Reviewed:  Reviewed past medical,surgical, social, obstetrical and family history.  Reviewed problem list, medications and allergies. Physical Assessment:   Vitals:   04/07/21 0952  BP: 119/76  Pulse: 95  Weight: 223 lb 12.8 oz (101.5 kg)  Body mass index is 37.24 kg/m.        Physical Examination:   General appearance: Well appearing, and in no distress  Mental status: Alert, oriented to person, place, and time  Skin: Warm & dry  Cardiovascular: Normal heart rate noted  Respiratory: Normal respiratory effort, no distress  Abdomen: Soft, gravid, nontender  Pelvic: Cervical exam deferred         Extremities: Edema:  None  Fetal Status: Fetal Heart Rate (bpm): 160   Movement: Absent    Chaperone: N/A   Results for orders placed or performed in visit on 04/07/21 (from the past 24 hour(s))  POC Urinalysis Dipstick OB   Collection Time: 04/07/21  9:57 AM  Result Value Ref Range   Color, UA     Clarity, UA     Glucose, UA Negative Negative   Bilirubin, UA     Ketones, UA neg    Spec Grav, UA     Blood, UA 3    pH, UA     POC,PROTEIN,UA Trace Negative, Trace, Small (1+), Moderate (2+), Large (3+), 4+   Urobilinogen, UA     Nitrite, UA neg    Leukocytes, UA Negative Negative   Appearance     Odor      Assessment & Plan:  1) Low-risk pregnancy G1P0 at [redacted]w[redacted]d with an Estimated Date of Delivery: 09/24/21   2) +THC, discussed today, heavy smoker prior to pregnancy, quit w/ +PT. Still in environments w/ others smoking (like car,etc). Discussed avoiding these situations or making sure there is good ventilation (window rolled down, etc). Will repeat later in pregnancy.   3) Hematuria> 3+, tr protein, asymptomatic, will send cx   Meds: No orders of the defined types were placed in this encounter.  Labs/procedures today: urine culture and 2nd IT  Plan:  Continue routine obstetrical care  Next visit: prefers will be in person for u/s    Reviewed:  Preterm labor symptoms and general obstetric precautions including but not limited to vaginal bleeding, contractions, leaking of fluid and fetal movement were reviewed in detail with the patient.  All questions were answered. Does have home bp cuff. Office bp cuff given: not applicable. Check bp weekly, let us know if consistently >140 and/or >90.  Follow-up: Return in about 3 weeks (around 04/28/2021) for LROB, [id number], CNM, in person.  No future appointments.  Orders Placed This Encounter  Procedures  . Urine Culture  . US OB Comp + 14 Wk  . INTEGRATED 2  . POC Urinalysis Dipstick OB   Cheral Marker CNM, [facility] 04/07/2021 10:32 AM

## 2021-04-07 NOTE — Addendum Note (Signed)
Addended by: Malachy Mood S on: 04/07/2021 11:06 AM   Modules accepted: Orders

## 2021-04-07 NOTE — Addendum Note (Signed)
Addended by: Federico Flake A on: 04/07/2021 11:01 AM   Modules accepted: Orders

## 2021-04-09 LAB — INTEGRATED 2
AFP MoM: 1
Alpha-Fetoprotein: 23.9 ng/mL
Crown Rump Length: 57.3 mm
DIA MoM: 3.35
DIA Value: 431 pg/mL
Estriol, Unconjugated: 0.94 ng/mL
Gest. Age on Collection Date: 12.1 weeks
Gestational Age: 15.3 weeks
Maternal Age at EDD: 28.7 yr
Nuchal Translucency (NT): 1.2 mm
Nuchal Translucency MoM: 0.94
Number of Fetuses: 1
PAPP-A MoM: 2.37
PAPP-A Value: 1275.4 ng/mL
Test Results:: NEGATIVE
Weight: 220 [lb_av]
Weight: 224 [lb_av]
hCG MoM: 1.29
hCG Value: 43 IU/mL
uE3 MoM: 1.37

## 2021-04-10 LAB — URINE CULTURE

## 2021-04-13 ENCOUNTER — Telehealth: Payer: Self-pay | Admitting: Women's Health

## 2021-04-13 NOTE — Telephone Encounter (Signed)
Message received again that call could not be completed.

## 2021-04-13 NOTE — Telephone Encounter (Signed)
Call could not be completed.  Will try again later to reach pt.

## 2021-04-13 NOTE — Telephone Encounter (Signed)
Pt called for lab results and stated that she is having white discharge and pressure

## 2021-04-14 NOTE — Telephone Encounter (Signed)
Patient called requesting results of urine culture.  Informed culture was negative.  Pt verbalized understanding .

## 2021-04-24 ENCOUNTER — Ambulatory Visit (INDEPENDENT_AMBULATORY_CARE_PROVIDER_SITE_OTHER): Payer: Medicaid Other | Admitting: Obstetrics & Gynecology

## 2021-04-24 ENCOUNTER — Other Ambulatory Visit: Payer: Self-pay

## 2021-04-24 ENCOUNTER — Ambulatory Visit (INDEPENDENT_AMBULATORY_CARE_PROVIDER_SITE_OTHER): Payer: Medicaid Other

## 2021-04-24 ENCOUNTER — Encounter: Payer: Self-pay | Admitting: Obstetrics & Gynecology

## 2021-04-24 VITALS — BP 129/75 | HR 86 | Wt 227.4 lb

## 2021-04-24 DIAGNOSIS — Z3A18 18 weeks gestation of pregnancy: Secondary | ICD-10-CM

## 2021-04-24 DIAGNOSIS — Z3402 Encounter for supervision of normal first pregnancy, second trimester: Secondary | ICD-10-CM

## 2021-04-24 DIAGNOSIS — Z363 Encounter for antenatal screening for malformations: Secondary | ICD-10-CM | POA: Diagnosis not present

## 2021-04-24 DIAGNOSIS — F1290 Cannabis use, unspecified, uncomplicated: Secondary | ICD-10-CM

## 2021-04-24 NOTE — Progress Notes (Signed)
[place] 18+1 wks,breech,posterior placenta gr 0,normal ovaries,cx 2.9 cm,SVP of fluid 3.9 cm,EFW 255 g 79%,limited view of heart because of fetal position,please have pt come back for additional images

## 2021-04-24 NOTE — Progress Notes (Signed)
   LOW-RISK PREGNANCY VISIT Patient name: Tammy [name] MRN [id number]  Date of birth: Mar 22, 1991 Chief Complaint:   Routine Prenatal Visit (Anatomy scan)  History of Present Illness:   Tammy [name] is a 30 y.o. G1P0 female at [redacted]w[redacted]d with an Estimated Date of Delivery: 09/24/21 being seen today for ongoing management of a low-risk pregnancy.   Depression screen [facility] 2/9 03/16/2021 02/04/2021  Decreased Interest 1 2  Down, Depressed, Hopeless 0 0  PHQ - 2 Score 1 2  Altered sleeping 0 1  Tired, decreased energy 0 1  Change in appetite 0 1  Feeling bad or failure about yourself  0 0  Trouble concentrating 0 0  Moving slowly or fidgety/restless 1 0  Suicidal thoughts 0 0  PHQ-9 Score 2 5    Today she reports no complaints. Contractions: Not present. Vag. Bleeding: None.  Movement: Present. denies leaking of fluid. Review of Systems:   Pertinent items are noted in HPI Denies abnormal vaginal discharge w/ itching/odor/irritation, headaches, visual changes, shortness of breath, chest pain, abdominal pain, severe nausea/vomiting, or problems with urination or bowel movements unless otherwise stated above. Pertinent History Reviewed:  Reviewed past medical,surgical, social, obstetrical and family history.  Reviewed problem list, medications and allergies.  Physical Assessment:   Vitals:   04/24/21 1209  BP: 129/75  Pulse: 86  Weight: 227 lb 6.4 oz (103.1 kg)  Body mass index is 37.84 kg/m.        Physical Examination:   General appearance: Well appearing, and in no distress  Mental status: Alert, oriented to person, place, and time  Skin: Warm & dry  Respiratory: Normal respiratory effort, no distress  Abdomen: Soft, gravid, nontender  Pelvic: Cervical exam deferred         Extremities: Edema: None  Psych:  mood and affect appropriate  Fetal Status: Fetal Heart Rate (bpm): 150 u/s   Movement: Present    Chaperone: n/a    No results found for this or any previous visit  (from the past 24 hour(s)).   Assessment & Plan:  1) Low-risk pregnancy G1P0 at [redacted]w[redacted]d with an Estimated Date of Delivery: 09/24/21  -cont ASA daily  2) h/o THC -repeat urine test  Meds: No orders of the defined types were placed in this encounter.  Labs/procedures today: Anatomy scan today  Plan:  Continue routine obstetrical care  Next visit: prefers in person    Reviewed: Preterm labor symptoms and general obstetric precautions including but not limited to vaginal bleeding, contractions, leaking of fluid and fetal movement were reviewed in detail with the patient.  All questions were answered.  Follow-up: Return in about 4 weeks (around 05/22/2021) for repeat anatomy scan/US and next visit.  Orders Placed This Encounter  Procedures  . Pain Management Screening Profile (10S)    Myna Hidalgo, DO Attending Obstetrician & Gynecologist, [facility] for [employer], [facility]

## 2021-05-20 ENCOUNTER — Other Ambulatory Visit: Payer: Self-pay | Admitting: Obstetrics & Gynecology

## 2021-05-20 DIAGNOSIS — Z362 Encounter for other antenatal screening follow-up: Secondary | ICD-10-CM

## 2021-05-22 ENCOUNTER — Encounter: Payer: Self-pay | Admitting: Advanced Practice Midwife

## 2021-05-22 ENCOUNTER — Ambulatory Visit (INDEPENDENT_AMBULATORY_CARE_PROVIDER_SITE_OTHER): Payer: PRIVATE HEALTH INSURANCE | Admitting: Advanced Practice Midwife

## 2021-05-22 ENCOUNTER — Ambulatory Visit (INDEPENDENT_AMBULATORY_CARE_PROVIDER_SITE_OTHER): Payer: Medicaid Other

## 2021-05-22 ENCOUNTER — Other Ambulatory Visit: Payer: Self-pay

## 2021-05-22 VITALS — BP 127/76 | HR 92 | Wt 232.4 lb

## 2021-05-22 DIAGNOSIS — Z362 Encounter for other antenatal screening follow-up: Secondary | ICD-10-CM | POA: Diagnosis not present

## 2021-05-22 DIAGNOSIS — Z3A22 22 weeks gestation of pregnancy: Secondary | ICD-10-CM | POA: Diagnosis not present

## 2021-05-22 DIAGNOSIS — Z3402 Encounter for supervision of normal first pregnancy, second trimester: Secondary | ICD-10-CM

## 2021-05-22 NOTE — Patient Instructions (Signed)
Tammy [name], I greatly value your feedback.  If you receive a survey following your visit with [place] today, we appreciate you taking the time to fill it out.  Thanks, Philipp Deputy, CNM   You will have your sugar test next visit.  Please do not eat or drink anything after midnight the night before you come, not even water.  You will be here for at least two hours.  Please make an appointment online for the bloodwork at [email or web address] for 8:30am (or as close to this as possible). Make sure you select the [facility] service center. The day of the appointment, check in with our office first, then you will go to [facility] to start the sugar test.    [facility] HAS MOVED!!! It is now [facility] ([address] [place], [place] [address]) Entrance C, located off of [address] valet parking  Go to [email or web address] to register for FREE online childbirth classes   Call the office [phone]) or go to [facility] if: You begin to have strong, frequent contractions Your water breaks.  Sometimes it is a big gush of fluid, sometimes it is just a trickle that keeps getting your panties wet or running down your legs You have vaginal bleeding.  It is normal to have a small amount of spotting if your cervix was checked.  You don't feel your baby moving like normal.  If you don't, get you something to eat and drink and lay down and focus on feeling your baby move.   If your baby is still not moving like normal, you should call the office or go to [facility].  [place] Pediatricians/Family Doctors: [facility] [phone]           [facility] [phone]                [facility] [phone] (usually not accepting new patients unless you have family there already, you are always welcome to call and ask)      [facility] Department [phone]       [facility] Pediatricians/Family Doctors:  [facility]: [phone] [facility]: [phone] [facility]: [phone]  [facility] Doctors:  [facility]: [phone]  [facility]: [phone]  [facility] Doctors: [facility]: [phone]   Home Blood Pressure Monitoring for Patients   Your provider has recommended that you check your blood pressure (BP) at least once a week at home. If you do not have a blood pressure cuff at home, one will be provided for you. Contact your provider if you have not received your monitor within 1 week.   Helpful Tips for Accurate Home Blood Pressure Checks  Don't smoke, exercise, or drink caffeine 30 minutes before checking your BP Use the restroom before checking your BP (a full bladder can raise your pressure) Relax in a comfortable upright chair Feet on the ground Left arm resting comfortably on a flat surface at the level of your heart Legs uncrossed Back supported Sit quietly and don't talk Place the cuff on your bare arm Adjust snuggly, so that only two fingertips can fit between your skin and the top of the cuff Check 2 readings separated by at least one minute Keep a log of your BP readings For a visual, please reference this diagram: http://ccnc.care/bpdiagram  Provider Name: [facility]     Phone: [phone]  Zone 1: ALL CLEAR  Continue to monitor your symptoms:  BP reading is less than 140 (top number) or less than 90 (bottom number)  No right upper stomach pain No headaches or seeing spots No feeling nauseated or throwing up No swelling in face and hands  Zone 2: CAUTION Call your doctor's office for any of the following:  BP reading is greater than 140 (top number) or greater than 90 (bottom number)  Stomach pain under your ribs in the middle or right side Headaches or seeing spots Feeling nauseated or throwing up Swelling in face and hands  Zone 3: EMERGENCY   Seek immediate medical care if you have any of the following:  BP reading is greater than160 (top number) or greater than 110 (bottom number) Severe headaches not improving with Tylenol Serious difficulty catching your breath Any worsening symptoms from Zone 2   Second Trimester of Pregnancy The second trimester is from week 13 through week 28, months 4 through 6. The second trimester is often a time when you feel your best. Your body has also adjusted to being pregnant, and you begin to feel better physically. Usually, morning sickness has lessened or quit completely, you may have more energy, and you may have an increase in appetite. The second trimester is also a time when the fetus is growing rapidly. At the end of the sixth month, the fetus is about 9 inches long and weighs about 1 pounds. You will likely begin to feel the baby move (quickening) between 18 and 20 weeks of the pregnancy. BODY CHANGES Your body goes through many changes during pregnancy. The changes vary from woman to woman.  Your weight will continue to increase. You will notice your lower abdomen bulging out. You may begin to get stretch marks on your hips, abdomen, and breasts. You may develop headaches that can be relieved by medicines approved by your health care provider. You may urinate more often because the fetus is pressing on your bladder. You may develop or continue to have heartburn as a result of your pregnancy. You may develop constipation because certain hormones are causing the muscles that push waste through your intestines to slow down. You may develop hemorrhoids or swollen, bulging veins (varicose veins). You may have back pain because of the weight gain and pregnancy hormones relaxing your joints between the bones in your pelvis and as a result of a shift in weight and the muscles that support your balance. Your breasts will continue to grow and be tender. Your gums may bleed and may be sensitive to  brushing and flossing. Dark spots or blotches (chloasma, mask of pregnancy) may develop on your face. This will likely fade after the baby is born. A dark line from your belly button to the pubic area (linea nigra) may appear. This will likely fade after the baby is born. You may have changes in your hair. These can include thickening of your hair, rapid growth, and changes in texture. Some women also have hair loss during or after pregnancy, or hair that feels dry or thin. Your hair will most likely return to normal after your baby is born. WHAT TO EXPECT AT YOUR PRENATAL VISITS During a routine prenatal visit: You will be weighed to make sure you and the fetus are growing normally. Your blood pressure will be taken. Your abdomen will be measured to track your baby's growth. The fetal heartbeat will be listened to. Any test results from the previous visit will be discussed. Your health care provider  may ask you: How you are feeling. If you are feeling the baby move. If you have had any abnormal symptoms, such as leaking fluid, bleeding, severe headaches, or abdominal cramping. If you have any questions. Other tests that may be performed during your second trimester include: Blood tests that check for: Low iron levels (anemia). Gestational diabetes (between 24 and 28 weeks). Rh antibodies. Urine tests to check for infections, diabetes, or protein in the urine. An ultrasound to confirm the proper growth and development of the baby. An amniocentesis to check for possible genetic problems. Fetal screens for spina bifida and Down syndrome. HOME CARE INSTRUCTIONS  Avoid all smoking, herbs, alcohol, and unprescribed drugs. These chemicals affect the formation and growth of the baby. Follow your health care provider's instructions regarding medicine use. There are medicines that are either safe or unsafe to take during pregnancy. Exercise only as directed by your health care provider.  Experiencing uterine cramps is a good sign to stop exercising. Continue to eat regular, healthy meals. Wear a good support bra for breast tenderness. Do not use hot tubs, steam rooms, or saunas. Wear your seat belt at all times when driving. Avoid raw meat, uncooked cheese, cat litter boxes, and soil used by cats. These carry germs that can cause birth defects in the baby. Take your prenatal vitamins. Try taking a stool softener (if your health care provider approves) if you develop constipation. Eat more high-fiber foods, such as fresh vegetables or fruit and whole grains. Drink plenty of fluids to keep your urine clear or pale yellow. Take warm sitz baths to soothe any pain or discomfort caused by hemorrhoids. Use hemorrhoid cream if your health care provider approves. If you develop varicose veins, wear support hose. Elevate your feet for 15 minutes, 3-4 times a day. Limit salt in your diet. Avoid heavy lifting, wear low heel shoes, and practice good posture. Rest with your legs elevated if you have leg cramps or low back pain. Visit your dentist if you have not gone yet during your pregnancy. Use a soft toothbrush to brush your teeth and be gentle when you floss. A sexual relationship may be continued unless your health care provider directs you otherwise. Continue to go to all your prenatal visits as directed by your health care provider. SEEK MEDICAL CARE IF:  You have dizziness. You have mild pelvic cramps, pelvic pressure, or nagging pain in the abdominal area. You have persistent nausea, vomiting, or diarrhea. You have a bad smelling vaginal discharge. You have pain with urination. SEEK IMMEDIATE MEDICAL CARE IF:  You have a fever. You are leaking fluid from your vagina. You have spotting or bleeding from your vagina. You have severe abdominal cramping or pain. You have rapid weight gain or loss. You have shortness of breath with chest pain. You notice sudden or extreme swelling  of your face, hands, ankles, feet, or legs. You have not felt your baby move in over an hour. You have severe headaches that do not go away with medicine. You have vision changes. Document Released: 11/02/2001 Document Revised: 11/13/2013 Document Reviewed: 01/09/2013 [facility] Patient Information 2015 [place], [place]. This information is not intended to replace advice given to you by your health care provider. Make sure you discuss any questions you have with your health care provider.

## 2021-05-22 NOTE — Progress Notes (Signed)
[place] 22+1 wks,frank breech,cx 4.1 cm,posterior placenta gr 0,normal left ovary,right ovary not visualized,svp of fluid 7.3 cm,fhr 177 bpm,anatomy of the heart complete,no obvious abnormalities,EFW 431 g 17%,BPD 3.4%

## 2021-05-22 NOTE — Progress Notes (Addendum)
   LOW-RISK PREGNANCY VISIT Patient name: Tammy [name] MRN [id number]  Date of birth: Dec 28, 1990 Chief Complaint:   Routine Prenatal Visit  History of Present Illness:   Tammy [name] is a 30 y.o. G1P0 female at [redacted]w[redacted]d with an Estimated Date of Delivery: 09/24/21 being seen today for ongoing management of a low-risk pregnancy.  Today she reports no complaints. Contractions: Not present. Vag. Bleeding: None.  Movement: Present. denies leaking of fluid. Review of Systems:   Pertinent items are noted in HPI Denies abnormal vaginal discharge w/ itching/odor/irritation, headaches, visual changes, shortness of breath, chest pain, abdominal pain, severe nausea/vomiting, or problems with urination or bowel movements unless otherwise stated above. Pertinent History Reviewed:  Reviewed past medical,surgical, social, obstetrical and family history.  Reviewed problem list, medications and allergies. Physical Assessment:   Vitals:   05/22/21 1039  BP: 127/76  Pulse: 92  Weight: 232 lb 6.4 oz (105.4 kg)  Body mass index is 38.67 kg/m.        Physical Examination:   General appearance: Well appearing, and in no distress  Mental status: Alert, oriented to person, place, and time  Skin: Warm & dry  Cardiovascular: Normal heart rate noted  Respiratory: Normal respiratory effort, no distress  Abdomen: Soft, gravid, nontender  Pelvic: Cervical exam deferred         Extremities: Edema: None  Fetal Status: Fetal Heart Rate (bpm): 177 u/s   Movement: Present    F/U anatomy: [place] 22+1 wks,frank breech,cx 4.1 cm,posterior placenta gr 0,normal left ovary,right ovary not visualized,svp of fluid 7.3 cm,fhr 177 bpm,anatomy of the heart complete,no obvious abnormalities,EFW 431 g 17%,BPD 3.4%  No results found for this or any previous visit (from the past 24 hour(s)).  Assessment & Plan:  1) Low-risk pregnancy G1P0 at [redacted]w[redacted]d with an Estimated Date of Delivery: 09/24/21 ; rest of heart anatomy seen  2)  Previous THC use, repeat today  3) BPD 3.4%, no f/u needed per [facility]   Meds: No orders of the defined types were placed in this encounter.  Labs/procedures today: f/u anatomy US  Plan:  Continue routine obstetrical care with PN2 at next visit  Reviewed: Preterm labor symptoms and general obstetric precautions including but not limited to vaginal bleeding, contractions, leaking of fluid and fetal movement were reviewed in detail with the patient.  All questions were answered. Has home bp cuff. Check bp weekly, let us know if >140/90.   Follow-up: Return in about 4 weeks (around 06/19/2021) for LROB, PN2, in person.  No orders of the defined types were placed in this encounter.  Arabella Merles CNM 05/22/2021 10:49 AM

## 2021-05-23 LAB — PMP SCREEN PROFILE (10S), URINE
Amphetamine Scrn, Ur: NEGATIVE ng/mL
BARBITURATE SCREEN URINE: NEGATIVE ng/mL
BENZODIAZEPINE SCREEN, URINE: NEGATIVE ng/mL
CANNABINOIDS UR QL SCN: POSITIVE ng/mL — AB
Cocaine (Metab) Scrn, Ur: NEGATIVE ng/mL
Creatinine(Crt), U: 202.6 mg/dL (ref 20.0–300.0)
Methadone Screen, Urine: NEGATIVE ng/mL
OXYCODONE+OXYMORPHONE UR QL SCN: NEGATIVE ng/mL
Opiate Scrn, Ur: NEGATIVE ng/mL
Ph of Urine: 6.5 (ref 4.5–8.9)
Phencyclidine Qn, Ur: NEGATIVE ng/mL
Propoxyphene Scrn, Ur: NEGATIVE ng/mL

## 2021-05-23 LAB — MED LIST OPTION NOT SELECTED

## 2021-06-19 ENCOUNTER — Other Ambulatory Visit: Payer: Self-pay

## 2021-06-19 ENCOUNTER — Other Ambulatory Visit: Payer: Medicaid Other

## 2021-06-19 ENCOUNTER — Encounter: Payer: Self-pay | Admitting: Medical

## 2021-06-19 ENCOUNTER — Ambulatory Visit (INDEPENDENT_AMBULATORY_CARE_PROVIDER_SITE_OTHER): Payer: Medicaid Other | Admitting: Medical

## 2021-06-19 VITALS — BP 117/68 | HR 84 | Wt 239.0 lb

## 2021-06-19 DIAGNOSIS — Z3A26 26 weeks gestation of pregnancy: Secondary | ICD-10-CM

## 2021-06-19 DIAGNOSIS — Z3402 Encounter for supervision of normal first pregnancy, second trimester: Secondary | ICD-10-CM

## 2021-06-19 NOTE — Progress Notes (Signed)
   PRENATAL VISIT NOTE  Subjective:  Tammy [name] is a 30 y.o. G1P0 at 100w1d being seen today for ongoing prenatal care.  She is currently monitored for the following issues for this low-risk pregnancy and has Supervision of normal first pregnancy and Marijuana use on their problem list.  Patient reports occasional contractions.  Contractions: Not present. Vag. Bleeding: None.  Movement: Present. Denies leaking of fluid.   The following portions of the patient's history were reviewed and updated as appropriate: allergies, current medications, past family history, past medical history, past social history, past surgical history and problem list.   Objective:   Vitals:   06/19/21 0908  BP: 117/68  Pulse: 84  Weight: 239 lb (108.4 kg)    Fetal Status: Fetal Heart Rate (bpm): 152   Movement: Present     General:  Alert, oriented and cooperative. Patient is in no acute distress.  Skin: Skin is warm and dry. No rash noted.   Cardiovascular: Normal heart rate noted  Respiratory: Normal respiratory effort, no problems with respiration noted  Abdomen: Soft, gravid, appropriate for gestational age.  Pain/Pressure: Absent     Pelvic: Cervical exam deferred        Extremities: Normal range of motion.  Edema: None  Mental Status: Normal mood and affect. Normal behavior. Normal judgment and thought content.   Assessment and Plan:  Pregnancy: G1P0 at [redacted]w[redacted]d 1. Encounter for supervision of normal first pregnancy in second trimester - Doing well - 2 hour GTT, CBC, HIV, RPR today - Deferred TDAP to next visit at patient's request - Occasional round ligament pain and BH contractions, discussed rest and abdominal binder for pain management   2. [redacted] weeks gestation of pregnancy  Preterm labor symptoms and general obstetric precautions including but not limited to vaginal bleeding, contractions, leaking of fluid and fetal movement were reviewed in detail with the patient. Please refer to After  Visit Summary for other counseling recommendations.   Return in about 2 weeks (around 07/03/2021) for LOB, In-Person.  No future appointments.  Vonzella Nipple, PA-C

## 2021-06-20 LAB — CBC
Hematocrit: 34 % (ref 34.0–46.6)
Hemoglobin: 11.2 g/dL (ref 11.1–15.9)
MCH: 30.7 pg (ref 26.6–33.0)
MCHC: 32.9 g/dL (ref 31.5–35.7)
MCV: 93 fL (ref 79–97)
Platelets: 412 10*3/uL (ref 150–450)
RBC: 3.65 x10E6/uL — ABNORMAL LOW (ref 3.77–5.28)
RDW: 11.9 % (ref 11.7–15.4)
WBC: 10.8 10*3/uL (ref 3.4–10.8)

## 2021-06-20 LAB — ANTIBODY SCREEN: Antibody Screen: NEGATIVE

## 2021-06-20 LAB — GLUCOSE TOLERANCE, 2 HOURS W/ 1HR
Glucose, 1 hour: 104 mg/dL (ref 65–179)
Glucose, 2 hour: 101 mg/dL (ref 65–152)
Glucose, Fasting: 88 mg/dL (ref 65–91)

## 2021-06-20 LAB — HIV ANTIBODY (ROUTINE TESTING W REFLEX): HIV Screen 4th Generation wRfx: NONREACTIVE

## 2021-06-20 LAB — RPR: RPR Ser Ql: NONREACTIVE

## 2021-07-02 ENCOUNTER — Encounter: Payer: Self-pay | Admitting: *Deleted

## 2021-07-03 ENCOUNTER — Ambulatory Visit (INDEPENDENT_AMBULATORY_CARE_PROVIDER_SITE_OTHER): Payer: Medicaid Other | Admitting: Family Medicine

## 2021-07-03 ENCOUNTER — Encounter: Payer: Self-pay | Admitting: Family Medicine

## 2021-07-03 ENCOUNTER — Other Ambulatory Visit: Payer: Self-pay

## 2021-07-03 VITALS — BP 125/74 | HR 96 | Wt 243.0 lb

## 2021-07-03 DIAGNOSIS — E669 Obesity, unspecified: Secondary | ICD-10-CM | POA: Insufficient documentation

## 2021-07-03 DIAGNOSIS — Z23 Encounter for immunization: Secondary | ICD-10-CM | POA: Diagnosis not present

## 2021-07-03 DIAGNOSIS — Z3A28 28 weeks gestation of pregnancy: Secondary | ICD-10-CM | POA: Diagnosis not present

## 2021-07-03 DIAGNOSIS — Z3403 Encounter for supervision of normal first pregnancy, third trimester: Secondary | ICD-10-CM

## 2021-07-03 DIAGNOSIS — F1290 Cannabis use, unspecified, uncomplicated: Secondary | ICD-10-CM

## 2021-07-03 DIAGNOSIS — J4520 Mild intermittent asthma, uncomplicated: Secondary | ICD-10-CM

## 2021-07-03 DIAGNOSIS — O99212 Obesity complicating pregnancy, second trimester: Secondary | ICD-10-CM

## 2021-07-03 MED ORDER — ALBUTEROL SULFATE HFA 108 (90 BASE) MCG/ACT IN AERS: 1.0000 | INHALATION_SPRAY | Freq: Four times a day (QID) | RESPIRATORY_TRACT | 1 refills | Status: AC | PRN

## 2021-07-03 NOTE — Progress Notes (Signed)
    Subjective:  Tammy [name] is a 30 y.o. G1P0 at [redacted]w[redacted]d being seen today for ongoing prenatal care.  She is currently monitored for the following issues for this low-risk pregnancy and has Supervision of normal first pregnancy and Marijuana use on their problem list.  Patient reports no complaints.  Contractions: Not present. Vag. Bleeding: None.  Movement: Present. Denies leaking of fluid.   The following portions of the patient's history were reviewed and updated as appropriate: allergies, current medications, past family history, past medical history, past social history, past surgical history and problem list.   Objective:   Vitals:   07/03/21 0917  BP: 125/74  Pulse: 96  Weight: 243 lb (110.2 kg)    Fetal Status: Fetal Heart Rate (bpm): 150 Fundal Height: 28 cm Movement: Present     General:  Alert, oriented and cooperative. Patient is in no acute distress.  Skin: Skin is warm and dry. No rash noted.   Cardiovascular: Normal heart rate noted  Respiratory: Normal respiratory effort, no problems with respiration noted  Abdomen: Soft, gravid, appropriate for gestational age. Pain/Pressure: Absent     Pelvic:  Cervical exam deferred        Extremities: Normal range of motion.  Edema: None  Mental Status: Normal mood and affect. Normal behavior. Normal judgment and thought content.    Assessment and Plan:  Pregnancy: G1P0 at [redacted]w[redacted]d  1. Encounter for supervision of normal first pregnancy in third trimester Third trimester labs WNL and reviewed with patient. No concerns - Tdap vaccine greater than or equal to 7yo IM  2. [redacted] weeks gestation of pregnancy - Tdap vaccine greater than or equal to 7yo IM  3. Marijuana use Quit in early pregnancy.   4. Obesity affecting pregnancy in second trimester TWG 28lb. Will monitor. Cont ASA daily.   Preterm labor symptoms and general obstetric precautions including but not limited to vaginal bleeding, contractions, leaking of fluid  and fetal movement were reviewed in detail with the patient. Please refer to After Visit Summary for other counseling recommendations.   Return in about 2 weeks (around 07/17/2021) for LROB.   Allayne Stack, DO

## 2021-07-17 ENCOUNTER — Ambulatory Visit (INDEPENDENT_AMBULATORY_CARE_PROVIDER_SITE_OTHER): Payer: Medicaid Other | Admitting: Medical

## 2021-07-17 ENCOUNTER — Encounter: Payer: Self-pay | Admitting: Medical

## 2021-07-17 ENCOUNTER — Other Ambulatory Visit: Payer: Self-pay

## 2021-07-17 VITALS — BP 131/75 | HR 99 | Wt 244.2 lb

## 2021-07-17 DIAGNOSIS — Z3A30 30 weeks gestation of pregnancy: Secondary | ICD-10-CM

## 2021-07-17 DIAGNOSIS — O99213 Obesity complicating pregnancy, third trimester: Secondary | ICD-10-CM

## 2021-07-17 DIAGNOSIS — Z3403 Encounter for supervision of normal first pregnancy, third trimester: Secondary | ICD-10-CM

## 2021-07-17 NOTE — Patient Instructions (Signed)

## 2021-07-17 NOTE — Progress Notes (Signed)
   PRENATAL VISIT NOTE  Subjective:  Tammy [name] is a 30 y.o. G1P0 at [redacted]w[redacted]d being seen today for ongoing prenatal care.  She is currently monitored for the following issues for this low-risk pregnancy and has Supervision of normal first pregnancy; Marijuana use; and Obesity on their problem list.  Patient reports no complaints.  Contractions: Irregular. Vag. Bleeding: None.  Movement: Present. Denies leaking of fluid.   The following portions of the patient's history were reviewed and updated as appropriate: allergies, current medications, past family history, past medical history, past social history, past surgical history and problem list.   Objective:   Vitals:   07/17/21 1014  BP: 131/75  Pulse: 99  Weight: 244 lb 3.2 oz (110.8 kg)    Fetal Status: Fetal Heart Rate (bpm): 145   Movement: Present     General:  Alert, oriented and cooperative. Patient is in no acute distress.  Skin: Skin is warm and dry. No rash noted.   Cardiovascular: Normal heart rate noted  Respiratory: Normal respiratory effort, no problems with respiration noted  Abdomen: Soft, gravid, appropriate for gestational age.  Pain/Pressure: Present     Pelvic: Cervical exam deferred        Extremities: Normal range of motion.  Edema: None  Mental Status: Normal mood and affect. Normal behavior. Normal judgment and thought content.   Assessment and Plan:  Pregnancy: G1P0 at [redacted]w[redacted]d 1. Encounter for supervision of normal first pregnancy in third trimester - Discussed normal GTT, CBC, HIV, RPR from last visit  - CBE info given   2. Obesity in pregnancy, antepartum, third trimester - Taking ASA  3. [redacted] weeks gestation of pregnancy  Preterm labor symptoms and general obstetric precautions including but not limited to vaginal bleeding, contractions, leaking of fluid and fetal movement were reviewed in detail with the patient. Please refer to After Visit Summary for other counseling recommendations.   Return  in about 2 weeks (around 07/31/2021) for LOB, In-Person.  Future Appointments  Date Time Provider Department Center  07/31/2021  9:30 AM Lazaro Arms, MD [facility]    Vonzella Nipple, PA-C

## 2021-07-31 ENCOUNTER — Ambulatory Visit (INDEPENDENT_AMBULATORY_CARE_PROVIDER_SITE_OTHER): Payer: Medicaid Other | Admitting: Obstetrics & Gynecology

## 2021-07-31 ENCOUNTER — Encounter: Payer: Self-pay | Admitting: Obstetrics & Gynecology

## 2021-07-31 ENCOUNTER — Other Ambulatory Visit: Payer: Self-pay

## 2021-07-31 VITALS — BP 120/80 | HR 90 | Wt 243.0 lb

## 2021-07-31 DIAGNOSIS — Z3403 Encounter for supervision of normal first pregnancy, third trimester: Secondary | ICD-10-CM

## 2021-07-31 DIAGNOSIS — Z3A32 32 weeks gestation of pregnancy: Secondary | ICD-10-CM

## 2021-07-31 DIAGNOSIS — F1290 Cannabis use, unspecified, uncomplicated: Secondary | ICD-10-CM

## 2021-07-31 MED ORDER — OMEPRAZOLE 20 MG PO CPDR: 20.0000 mg | DELAYED_RELEASE_CAPSULE | Freq: Every day | ORAL | 6 refills | Status: DC

## 2021-07-31 NOTE — Progress Notes (Signed)
   LOW-RISK PREGNANCY VISIT Patient name: Tammy [name] MRN [id number]  Date of birth: [birth year] Chief Complaint:   Routine Prenatal Visit  History of Present Illness:   Tammy [name] is a 30 y.o. G1P0 female at [redacted]w[redacted]d with an Estimated Date of Delivery: 09/24/21 being seen today for ongoing management of a low-risk pregnancy.  Depression screen [facility] 2/9 06/19/2021 03/16/2021 02/04/2021  Decreased Interest 0 1 2  Down, Depressed, Hopeless 0 0 0  PHQ - 2 Score 0 1 2  Altered sleeping 0 0 1  Tired, decreased energy 0 0 1  Change in appetite 0 0 1  Feeling bad or failure about yourself  0 0 0  Trouble concentrating 0 0 0  Moving slowly or fidgety/restless 0 1 0  Suicidal thoughts 0 0 0  PHQ-9 Score 0 2 5    Today she reports no complaints. Contractions: Not present. Vag. Bleeding: None.   . denies leaking of fluid. Review of Systems:   Pertinent items are noted in HPI Denies abnormal vaginal discharge w/ itching/odor/irritation, headaches, visual changes, shortness of breath, chest pain, abdominal pain, severe nausea/vomiting, or problems with urination or bowel movements unless otherwise stated above. Pertinent History Reviewed:  Reviewed past medical,surgical, social, obstetrical and family history.  Reviewed problem list, medications and allergies. Physical Assessment:   Vitals:   07/31/21 0939  BP: 120/80  Pulse: 90  Weight: 243 lb (110.2 kg)  Body mass index is 40.44 kg/m.        Physical Examination:   General appearance: Well appearing, and in no distress  Mental status: Alert, oriented to person, place, and time  Skin: Warm & dry  Cardiovascular: Normal heart rate noted  Respiratory: Normal respiratory effort, no distress  Abdomen: Soft, gravid, nontender  Pelvic: Cervical exam deferred         Extremities: Edema: None  Fetal Status:          Chaperone: n/a    No results found for this or any previous visit (from the past 24 hour(s)).  Assessment & Plan:   1) Low-risk pregnancy G1P0 at [redacted]w[redacted]d with an Estimated Date of Delivery: 09/24/21   GERD, start omeprazole   Meds:  Meds ordered this encounter  Medications   omeprazole (PRILOSEC) 20 MG capsule    Sig: Take 1 capsule (20 mg total) by mouth daily. 1 tablet a day    Dispense:  30 capsule    Refill:  6   Labs/procedures today:   Plan:  Continue routine obstetrical care  Next visit: prefers in person    Reviewed: Preterm labor symptoms and general obstetric precautions including but not limited to vaginal bleeding, contractions, leaking of fluid and fetal movement were reviewed in detail with the patient.  All questions were answered. Has home bp cuff. Rx faxed to . Check bp weekly, let us know if >140/90.   Follow-up: Return in about 2 weeks (around 08/14/2021) for LROB.  Orders Placed This Encounter  Procedures   Pain Management Screening Profile (10S)    Lazaro Arms, MD 07/31/2021 10:15 AM

## 2021-08-01 LAB — PMP SCREEN PROFILE (10S), URINE
Amphetamine Scrn, Ur: NEGATIVE ng/mL
BARBITURATE SCREEN URINE: NEGATIVE ng/mL
BENZODIAZEPINE SCREEN, URINE: NEGATIVE ng/mL
CANNABINOIDS UR QL SCN: NEGATIVE ng/mL
Cocaine (Metab) Scrn, Ur: NEGATIVE ng/mL
Creatinine(Crt), U: 83.9 mg/dL (ref 20.0–300.0)
Methadone Screen, Urine: NEGATIVE ng/mL
OXYCODONE+OXYMORPHONE UR QL SCN: NEGATIVE ng/mL
Opiate Scrn, Ur: NEGATIVE ng/mL
Ph of Urine: 6.5 (ref 4.5–8.9)
Phencyclidine Qn, Ur: NEGATIVE ng/mL
Propoxyphene Scrn, Ur: NEGATIVE ng/mL

## 2021-08-14 ENCOUNTER — Encounter: Payer: Self-pay | Admitting: Advanced Practice Midwife

## 2021-08-14 ENCOUNTER — Ambulatory Visit (INDEPENDENT_AMBULATORY_CARE_PROVIDER_SITE_OTHER): Payer: Medicaid Other | Admitting: Advanced Practice Midwife

## 2021-08-14 ENCOUNTER — Other Ambulatory Visit: Payer: Self-pay

## 2021-08-14 VITALS — BP 119/80 | HR 83 | Wt 250.0 lb

## 2021-08-14 DIAGNOSIS — F1290 Cannabis use, unspecified, uncomplicated: Secondary | ICD-10-CM

## 2021-08-14 DIAGNOSIS — Z3403 Encounter for supervision of normal first pregnancy, third trimester: Secondary | ICD-10-CM

## 2021-08-14 DIAGNOSIS — Z3A34 34 weeks gestation of pregnancy: Secondary | ICD-10-CM

## 2021-08-14 NOTE — Progress Notes (Signed)
   LOW-RISK PREGNANCY VISIT Patient name: Tammy [name] MRN [id number]  Date of birth: 09-01-1991 Chief Complaint:   Routine Prenatal Visit  History of Present Illness:   Tammy [name] is a 30 y.o. G1P0 female at [redacted]w[redacted]d with an Estimated Date of Delivery: 09/24/21 being seen today for ongoing management of a low-risk pregnancy.  Today she reports no complaints. Contractions: Not present.  .  Movement: Present. denies leaking of fluid. Review of Systems:   Pertinent items are noted in HPI Denies abnormal vaginal discharge w/ itching/odor/irritation, headaches, visual changes, shortness of breath, chest pain, abdominal pain, severe nausea/vomiting, or problems with urination or bowel movements unless otherwise stated above. Pertinent History Reviewed:  Reviewed past medical,surgical, social, obstetrical and family history.  Reviewed problem list, medications and allergies. Physical Assessment:   Vitals:   08/14/21 0937  BP: 119/80  Pulse: 83  Weight: 250 lb (113.4 kg)  Body mass index is 41.6 kg/m.        Physical Examination:   General appearance: Well appearing, and in no distress  Mental status: Alert, oriented to person, place, and time  Skin: Warm & dry  Cardiovascular: Normal heart rate noted  Respiratory: Normal respiratory effort, no distress  Abdomen: Soft, gravid, nontender  Pelvic: Cervical exam deferred         Extremities: Edema: None  Fetal Status: Fetal Heart Rate (bpm): 152 Fundal Height: 34 cm Movement: Present    No results found for this or any previous visit (from the past 24 hour(s)).  Assessment & Plan:  1) Low-risk pregnancy G1P0 at [redacted]w[redacted]d with an Estimated Date of Delivery: 09/24/21     Meds: No orders of the defined types were placed in this encounter.  Labs/procedures today: declines flu  Plan:  Continue routine obstetrical care   Reviewed: Preterm labor symptoms and general obstetric precautions including but not limited to vaginal bleeding,  contractions, leaking of fluid and fetal movement were reviewed in detail with the patient.  All questions were answered. Has home bp cuff. Check bp weekly, let us know if >140/90.   Follow-up: Return in about 2 weeks (around 08/28/2021) for LROB, in person; GBS/cultures.  No orders of the defined types were placed in this encounter.  Arabella Merles CNM 08/14/2021 10:00 AM

## 2021-08-14 NOTE — Patient Instructions (Signed)
Sacred, thank you for choosing our office today! We appreciate the opportunity to meet your healthcare needs. You may receive a short survey by mail, e-mail, or through [employer]. If you are happy with your care we would appreciate if you could take just a few minutes to complete the survey questions. We read all of your comments and take your feedback very seriously. Thank you again for choosing our office.  Center for [employer] Team at [facility]  [facility] at [facility] ([address] [place], [place] [address]) Entrance C, located off of [address] Free 24/7 valet parking   CLASSES: Go to [email or web address] to register for classes (childbirth, breastfeeding, waterbirth, infant CPR, daddy bootcamp, etc.)  Call the office [phone]) or go to [facility] if: You begin to have strong, frequent contractions Your water breaks.  Sometimes it is a big gush of fluid, sometimes it is just a trickle that keeps getting your panties wet or running down your legs You have vaginal bleeding.  It is normal to have a small amount of spotting if your cervix was checked.  You don't feel your baby moving like normal.  If you don't, get you something to eat and drink and lay down and focus on feeling your baby move.   If your baby is still not moving like normal, you should call the office or go to [facility].  Call the office [phone]) or go to [facility] hospital for these signs of pre-eclampsia: Severe headache that does not go away with Tylenol Visual changes- seeing spots, double, blurred vision Pain under your right breast or upper abdomen that does not go away with Tums or heartburn medicine Nausea and/or vomiting Severe swelling in your hands, feet, and face   Tdap Vaccine It is recommended that you get the Tdap vaccine during the third trimester of EACH pregnancy to help protect your baby from getting pertussis (whooping cough) 27-36 weeks is the BEST time to do  this so that you can pass the protection on to your baby. During pregnancy is better than after pregnancy, but if you are unable to get it during pregnancy it will be offered at the hospital.  You can get this vaccine with [place], at the health department, your family doctor, or some local pharmacies Everyone who will be around your baby should also be up-to-date on their vaccines before the baby comes. Adults (who are not pregnant) only need 1 dose of Tdap during adulthood.   [facility] Pediatricians/Family Doctors [facility]): [address] Colette Ribas, [phone]           [facility]: [address], [phone]                [facility]): [address], [phone] (call to ask if accepting patients) [facility]: [address], [place], [phone]    [facility] Pediatricians/Family Doctors [facility]): [phone] [address], [phone] [facility]: [address] [place], [phone] [facility]: [address], [phone]  [facility] Doctors  [facility]): [phone] [facility]: [address] [phone]   [facility] Doctors [facility]: [address], [phone]  [facility] Family Doctors  [facility]: [phone], [phone]  Home Blood Pressure Monitoring for Patients   Your provider has recommended that you check your  blood pressure (BP) at least once a week at home. If you do not have a blood pressure cuff at home, one will be provided for you. Contact your provider if you have not received your monitor within 1 week.   Helpful Tips for Accurate Home Blood Pressure Checks  Don't smoke, exercise, or drink caffeine 30 minutes before checking your BP Use the restroom before checking your BP (a full bladder can raise your  pressure) Relax in a comfortable upright chair Feet on the ground Left arm resting comfortably on a flat surface at the level of your heart Legs uncrossed Back supported Sit quietly and don't talk Place the cuff on your bare arm Adjust snuggly, so that only two fingertips can fit between your skin and the top of the cuff Check 2 readings separated by at least one minute Keep a log of your BP readings For a visual, please reference this diagram: http://ccnc.care/bpdiagram  Provider Name: [facility]     Phone: [phone]  Zone 1: ALL CLEAR  Continue to monitor your symptoms:  BP reading is less than 140 (top number) or less than 90 (bottom number)  No right upper stomach pain No headaches or seeing spots No feeling nauseated or throwing up No swelling in face and hands  Zone 2: CAUTION Call your doctor's office for any of the following:  BP reading is greater than 140 (top number) or greater than 90 (bottom number)  Stomach pain under your ribs in the middle or right side Headaches or seeing spots Feeling nauseated or throwing up Swelling in face and hands  Zone 3: EMERGENCY  Seek immediate medical care if you have any of the following:  BP reading is greater than160 (top number) or greater than 110 (bottom number) Severe headaches not improving with Tylenol Serious difficulty catching your breath Any worsening symptoms from Zone 2   Third Trimester of Pregnancy The third trimester is from week 29 through week 42, months 7 through 9. The third trimester is a time when the fetus is growing rapidly. At the end of the ninth month, the fetus is about 20 inches in length and weighs 6-10 pounds.  BODY CHANGES Your body goes through many changes during pregnancy. The changes vary from woman to woman.  Your weight will continue to increase. You can expect to gain 25-35 pounds (11-16 kg) by the end of the pregnancy. You may begin to get stretch marks on your hips, abdomen,  and breasts. You may urinate more often because the fetus is moving lower into your pelvis and pressing on your bladder. You may develop or continue to have heartburn as a result of your pregnancy. You may develop constipation because certain hormones are causing the muscles that push waste through your intestines to slow down. You may develop hemorrhoids or swollen, bulging veins (varicose veins). You may have pelvic pain because of the weight gain and pregnancy hormones relaxing your joints between the bones in your pelvis. Backaches may result from overexertion of the muscles supporting your posture. You may have changes in your hair. These can include thickening of your hair, rapid growth, and changes in texture. Some women also have hair loss during or after pregnancy, or hair that feels dry or thin. Your hair will most likely return to normal after your baby is born. Your breasts will continue to grow and be tender. A yellow discharge may leak from your breasts called colostrum. Your belly button may stick out. You may   feel short of breath because of your expanding uterus. You may notice the fetus "dropping," or moving lower in your abdomen. You may have a bloody mucus discharge. This usually occurs a few days to a week before labor begins. Your cervix becomes thin and soft (effaced) near your due date. WHAT TO EXPECT AT YOUR PRENATAL EXAMS  You will have prenatal exams every 2 weeks until week 36. Then, you will have weekly prenatal exams. During a routine prenatal visit: You will be weighed to make sure you and the fetus are growing normally. Your blood pressure is taken. Your abdomen will be measured to track your baby's growth. The fetal heartbeat will be listened to. Any test results from the previous visit will be discussed. You may have a cervical check near your due date to see if you have effaced. At around 36 weeks, your caregiver will check your cervix. At the same time, your  caregiver will also perform a test on the secretions of the vaginal tissue. This test is to determine if a type of bacteria, Group B streptococcus, is present. Your caregiver will explain this further. Your caregiver may ask you: What your birth plan is. How you are feeling. If you are feeling the baby move. If you have had any abnormal symptoms, such as leaking fluid, bleeding, severe headaches, or abdominal cramping. If you have any questions. Other tests or screenings that may be performed during your third trimester include: Blood tests that check for low iron levels (anemia). Fetal testing to check the health, activity level, and growth of the fetus. Testing is done if you have certain medical conditions or if there are problems during the pregnancy. FALSE LABOR You may feel small, irregular contractions that eventually go away. These are called Braxton Hicks contractions, or false labor. Contractions may last for hours, days, or even weeks before true labor sets in. If contractions come at regular intervals, intensify, or become painful, it is best to be seen by your caregiver.  SIGNS OF LABOR  Menstrual-like cramps. Contractions that are 5 minutes apart or less. Contractions that start on the top of the uterus and spread down to the lower abdomen and back. A sense of increased pelvic pressure or back pain. A watery or bloody mucus discharge that comes from the vagina. If you have any of these signs before the 37th week of pregnancy, call your caregiver right away. You need to go to the hospital to get checked immediately. HOME CARE INSTRUCTIONS  Avoid all smoking, herbs, alcohol, and unprescribed drugs. These chemicals affect the formation and growth of the baby. Follow your caregiver's instructions regarding medicine use. There are medicines that are either safe or unsafe to take during pregnancy. Exercise only as directed by your caregiver. Experiencing uterine cramps is a good sign to  stop exercising. Continue to eat regular, healthy meals. Wear a good support bra for breast tenderness. Do not use hot tubs, steam rooms, or saunas. Wear your seat belt at all times when driving. Avoid raw meat, uncooked cheese, cat litter boxes, and soil used by cats. These carry germs that can cause birth defects in the baby. Take your prenatal vitamins. Try taking a stool softener (if your caregiver approves) if you develop constipation. Eat more high-fiber foods, such as fresh vegetables or fruit and whole grains. Drink plenty of fluids to keep your urine clear or pale yellow. Take warm sitz baths to soothe any pain or discomfort caused by hemorrhoids. Use hemorrhoid cream if   your caregiver approves. If you develop varicose veins, wear support hose. Elevate your feet for 15 minutes, 3-4 times a day. Limit salt in your diet. Avoid heavy lifting, wear low heal shoes, and practice good posture. Rest a lot with your legs elevated if you have leg cramps or low back pain. Visit your dentist if you have not gone during your pregnancy. Use a soft toothbrush to brush your teeth and be gentle when you floss. A sexual relationship may be continued unless your caregiver directs you otherwise. Do not travel far distances unless it is absolutely necessary and only with the approval of your caregiver. Take prenatal classes to understand, practice, and ask questions about the labor and delivery. Make a trial run to the hospital. Pack your hospital bag. Prepare the baby's nursery. Continue to go to all your prenatal visits as directed by your caregiver. SEEK MEDICAL CARE IF: You are unsure if you are in labor or if your water has broken. You have dizziness. You have mild pelvic cramps, pelvic pressure, or nagging pain in your abdominal area. You have persistent nausea, vomiting, or diarrhea. You have a bad smelling vaginal discharge. You have pain with urination. SEEK IMMEDIATE MEDICAL CARE IF:  You  have a fever. You are leaking fluid from your vagina. You have spotting or bleeding from your vagina. You have severe abdominal cramping or pain. You have rapid weight loss or gain. You have shortness of breath with chest pain. You notice sudden or extreme swelling of your face, hands, ankles, feet, or legs. You have not felt your baby move in over an hour. You have severe headaches that do not go away with medicine. You have vision changes. Document Released: 11/02/2001 Document Revised: 11/13/2013 Document Reviewed: 01/09/2013 [facility] Patient Information 2015 [place], [place]. This information is not intended to replace advice given to you by your health care provider. Make sure you discuss any questions you have with your health care provider.

## 2021-08-31 ENCOUNTER — Encounter: Payer: Self-pay | Admitting: Women's Health

## 2021-08-31 ENCOUNTER — Ambulatory Visit (INDEPENDENT_AMBULATORY_CARE_PROVIDER_SITE_OTHER): Payer: Medicaid Other | Admitting: Women's Health

## 2021-08-31 ENCOUNTER — Other Ambulatory Visit: Payer: Self-pay

## 2021-08-31 ENCOUNTER — Other Ambulatory Visit (HOSPITAL_COMMUNITY)
Admission: RE | Admit: 2021-08-31 | Discharge: 2021-08-31 | Disposition: A | Discharge status: Home / Self Care | Payer: Medicaid Other | Source: Ambulatory Visit | Attending: Women's Health | Admitting: Women's Health

## 2021-08-31 VITALS — BP 133/81 | HR 96 | Wt 256.0 lb

## 2021-08-31 DIAGNOSIS — Z3403 Encounter for supervision of normal first pregnancy, third trimester: Secondary | ICD-10-CM | POA: Insufficient documentation

## 2021-08-31 DIAGNOSIS — Z3A36 36 weeks gestation of pregnancy: Secondary | ICD-10-CM

## 2021-08-31 LAB — OB RESULTS CONSOLE GBS: GBS: POSITIVE

## 2021-08-31 LAB — OB RESULTS CONSOLE GC/CHLAMYDIA: Gonorrhea: NEGATIVE

## 2021-08-31 NOTE — Patient Instructions (Signed)
Shaily, thank you for choosing our office today! We appreciate the opportunity to meet your healthcare needs. You may receive a short survey by mail, e-mail, or through [employer]. If you are happy with your care we would appreciate if you could take just a few minutes to complete the survey questions. We read all of your comments and take your feedback very seriously. Thank you again for choosing our office.  Center for [employer] Team at [facility]  [facility] ([address] [place], [place] [address]) Entrance C, located off of [address] Free 24/7 valet parking   CLASSES: Go to [email or web address] to register for classes (childbirth, breastfeeding, waterbirth, infant CPR, daddy bootcamp, etc.)  Call the office [phone]) or go to [facility] if: You begin to have strong, frequent contractions Your water breaks.  Sometimes it is a big gush of fluid, sometimes it is just a trickle that keeps getting your panties wet or running down your legs You have vaginal bleeding.  It is normal to have a small amount of spotting if your cervix was checked.  You don't feel your baby moving like normal.  If you don't, get you something to eat and drink and lay down and focus on feeling your baby move.   If your baby is still not moving like normal, you should call the office or go to [facility].  Call the office [phone]) or go to [facility] hospital for these signs of pre-eclampsia: Severe headache that does not go away with Tylenol Visual changes- seeing spots, double, blurred vision Pain under your right breast or upper abdomen that does not go away with Tums or heartburn medicine Nausea and/or vomiting Severe swelling in your hands, feet, and face   [facility] Pediatricians/Family Doctors [facility]): [address] Colette Ribas, [phone]           [facility]: [address], [phone]                 [name], [phone] (call to ask if accepting patients) [facility] Department: [address], [place], [phone]    [facility] Pediatricians/Family Doctors [facility]): [address], [phone] [facility]: [address] [place], [phone] [facility]: [address] [place] [address], [phone]  [facility] Doctors  [facility]): [phone] [facility]: [address] [phone]   [facility] Doctors [facility]: [address], [phone]  [facility] Doctors  [facility]: [phone], [phone]  Home Blood Pressure Monitoring for Patients   Your provider has recommended that you check your blood pressure (BP) at least once a week at home. If you do not have a blood pressure cuff at home, one will be provided for you. Contact your provider if you have not received your monitor within 1 week.   Helpful Tips for Accurate Home Blood Pressure Checks  Don't smoke, exercise, or drink caffeine 30 minutes before checking your BP Use the restroom before checking your BP (a full bladder can raise your pressure) Relax in a comfortable upright chair Feet on the ground Left arm resting comfortably on a flat surface at the level of your heart Legs uncrossed Back supported Sit quietly and don't talk Place the cuff on your bare arm Adjust snuggly, so that only two fingertips  can fit between your skin and the top of the cuff Check 2 readings separated by at least one minute Keep a log of your BP readings For a visual, please reference this diagram: http://ccnc.care/bpdiagram  Provider Name: [facility]     Phone: [phone]  Zone 1: ALL CLEAR  Continue to monitor your symptoms:  BP reading is less than 140 (top number) or less than 90 (bottom number)  No right  upper stomach pain No headaches or seeing spots No feeling nauseated or throwing up No swelling in face and hands  Zone 2: CAUTION Call your doctor's office for any of the following:  BP reading is greater than 140 (top number) or greater than 90 (bottom number)  Stomach pain under your ribs in the middle or right side Headaches or seeing spots Feeling nauseated or throwing up Swelling in face and hands  Zone 3: EMERGENCY  Seek immediate medical care if you have any of the following:  BP reading is greater than160 (top number) or greater than 110 (bottom number) Severe headaches not improving with Tylenol Serious difficulty catching your breath Any worsening symptoms from Zone 2   Braxton Hicks Contractions Contractions of the uterus can occur throughout pregnancy, but they are not always a sign that you are in labor. You may have practice contractions called Braxton Hicks contractions. These false labor contractions are sometimes confused with true labor. What are [place] contractions? Braxton Hicks contractions are tightening movements that occur in the muscles of the uterus before labor. Unlike true labor contractions, these contractions do not result in opening (dilation) and thinning of the cervix. Toward the end of pregnancy (32-34 weeks), Braxton Hicks contractions can happen more often and may become stronger. These contractions are sometimes difficult to tell apart from true labor because they can be very uncomfortable. You should not feel embarrassed if you go to the hospital with false labor. Sometimes, the only way to tell if you are in true labor is for your health care provider to look for changes in the cervix. The health care provider will do a physical exam and may monitor your contractions. If you are not in true labor, the exam should show that your cervix is not dilating and your water has not broken. If there are no other health problems associated with your  pregnancy, it is completely safe for you to be sent home with false labor. You may continue to have Braxton Hicks contractions until you go into true labor. How to tell the difference between true labor and false labor True labor Contractions last 30-70 seconds. Contractions become very regular. Discomfort is usually felt in the top of the uterus, and it spreads to the lower abdomen and low back. Contractions do not go away with walking. Contractions usually become more intense and increase in frequency. The cervix dilates and gets thinner. False labor Contractions are usually shorter and not as strong as true labor contractions. Contractions are usually irregular. Contractions are often felt in the front of the lower abdomen and in the groin. Contractions may go away when you walk around or change positions while lying down. Contractions get weaker and are shorter-lasting as time goes on. The cervix usually does not dilate or become thin. Follow these instructions at home:  Take over-the-counter and prescription medicines only as told by your health care provider. Keep up with your usual exercises and follow other instructions from your health care provider. Eat and drink lightly if you think  you are going into labor. If Braxton Hicks contractions are making you uncomfortable: Change your position from lying down or resting to walking, or change from walking to resting. Sit and rest in a tub of warm water. Drink enough fluid to keep your urine pale yellow. Dehydration may cause these contractions. Do slow and deep breathing several times an hour. Keep all follow-up prenatal visits as told by your health care provider. This is important. Contact a health care provider if: You have a fever. You have continuous pain in your abdomen. Get help right away if: Your contractions become stronger, more regular, and closer together. You have fluid leaking or gushing from your vagina. You pass  blood-tinged mucus (bloody show). You have bleeding from your vagina. You have low back pain that you never had before. You feel your baby's head pushing down and causing pelvic pressure. Your baby is not moving inside you as much as it used to. Summary Contractions that occur before labor are called Braxton Hicks contractions, false labor, or practice contractions. Braxton Hicks contractions are usually shorter, weaker, farther apart, and less regular than true labor contractions. True labor contractions usually become progressively stronger and regular, and they become more frequent. Manage discomfort from [facility] contractions by changing position, resting in a warm bath, drinking plenty of water, or practicing deep breathing. This information is not intended to replace advice given to you by your health care provider. Make sure you discuss any questions you have with your health care provider. Document Revised: 10/21/2017 Document Reviewed: 03/24/2017 Elsevier Patient Education  [place].

## 2021-08-31 NOTE — Progress Notes (Signed)
    LOW-RISK PREGNANCY VISIT Patient name: Tammy [name] MRN [id number]  Date of birth: June 01, 1991 Chief Complaint:   Routine Prenatal Visit  History of Present Illness:   Elonda L Sacco is a 30 y.o. G1P0 female at [redacted]w[redacted]d with an Estimated Date of Delivery: 09/24/21 being seen today for ongoing management of a low-risk pregnancy.   Today she reports no complaints. Contractions: Not present. Vag. Bleeding: None.  Movement: Present. denies leaking of fluid.  Depression screen [facility] 2/9 06/19/2021 03/16/2021 02/04/2021  Decreased Interest 0 1 2  Down, Depressed, Hopeless 0 0 0  PHQ - 2 Score 0 1 2  Altered sleeping 0 0 1  Tired, decreased energy 0 0 1  Change in appetite 0 0 1  Feeling bad or failure about yourself  0 0 0  Trouble concentrating 0 0 0  Moving slowly or fidgety/restless 0 1 0  Suicidal thoughts 0 0 0  PHQ-9 Score 0 2 5     GAD 7 : Generalized Anxiety Score 06/19/2021 03/16/2021 02/04/2021  Nervous, Anxious, on Edge 0 0 0  Control/stop worrying 0 0 1  Worry too much - different things 0 0 1  Trouble relaxing 0 0 1  Restless 0 0 0  Easily annoyed or irritable 0 1 2  Afraid - awful might happen 0 0 2  Total GAD 7 Score 0 1 7      Review of Systems:   Pertinent items are noted in HPI Denies abnormal vaginal discharge w/ itching/odor/irritation, headaches, visual changes, shortness of breath, chest pain, abdominal pain, severe nausea/vomiting, or problems with urination or bowel movements unless otherwise stated above. Pertinent History Reviewed:  Reviewed past medical,surgical, social, obstetrical and family history.  Reviewed problem list, medications and allergies. Physical Assessment:   Vitals:   08/31/21 0910  BP: 133/81  Pulse: 96  Weight: 256 lb (116.1 kg)  Body mass index is 42.6 kg/m.        Physical Examination:   General appearance: Well appearing, and in no distress  Mental status: Alert, oriented to person, place, and time  Skin: Warm &  dry  Cardiovascular: Normal heart rate noted  Respiratory: Normal respiratory effort, no distress  Abdomen: Soft, gravid, nontender  Pelvic: Cervical exam performed  Dilation: 1 Effacement (%): Thick Station: -3  Extremities: Edema: None  Fetal Status: Fetal Heart Rate (bpm): 154 Fundal Height: 37 cm Movement: Present Presentation: Vertex  Chaperone: Faith Rogue   No results found for this or any previous visit (from the past 24 hour(s)).  Assessment & Plan:  1) Low-risk pregnancy G1P0 at [redacted]w[redacted]d with an Estimated Date of Delivery: 09/24/21   Meds: No orders of the defined types were placed in this encounter.  Labs/procedures today: GBS, GC/CT, and SVE  Plan:  Continue routine obstetrical care  Next visit: prefers in person    Reviewed: Preterm labor symptoms and general obstetric precautions including but not limited to vaginal bleeding, contractions, leaking of fluid and fetal movement were reviewed in detail with the patient.  All questions were answered. Does have home bp cuff. Office bp cuff given: not applicable. Check bp weekly, let us know if consistently >140 and/or >90.  Follow-up: Return for weekly, CNM, in person.  No future appointments.  Orders Placed This Encounter  Procedures   Strep Gp B NAA+Rflx   Cheral Marker CNM, WHNP-BC 08/31/2021 9:30 AM

## 2021-09-01 LAB — CERVICOVAGINAL ANCILLARY ONLY
Chlamydia: NEGATIVE
Comment: NEGATIVE
Comment: NORMAL
Neisseria Gonorrhea: NEGATIVE

## 2021-09-06 LAB — STREP GP B SUSCEPTIBILITY

## 2021-09-06 LAB — STREP GP B NAA+RFLX: Strep Gp B NAA+Rflx: POSITIVE — AB

## 2021-09-07 ENCOUNTER — Encounter: Payer: Self-pay | Admitting: Obstetrics & Gynecology

## 2021-09-07 ENCOUNTER — Ambulatory Visit (INDEPENDENT_AMBULATORY_CARE_PROVIDER_SITE_OTHER): Payer: Medicaid Other | Admitting: Obstetrics & Gynecology

## 2021-09-07 ENCOUNTER — Other Ambulatory Visit: Payer: Self-pay

## 2021-09-07 VITALS — BP 127/84 | HR 89 | Wt 258.6 lb

## 2021-09-07 DIAGNOSIS — Z3403 Encounter for supervision of normal first pregnancy, third trimester: Secondary | ICD-10-CM

## 2021-09-07 DIAGNOSIS — Z3A37 37 weeks gestation of pregnancy: Secondary | ICD-10-CM

## 2021-09-07 NOTE — Progress Notes (Signed)
   LOW-RISK PREGNANCY VISIT Patient name: Tammy [name] MRN [id number]  Date of birth: 1991/05/12 Chief Complaint:   Routine Prenatal Visit  History of Present Illness:   Tammy [name] is a 30 y.o. G1P0 female at [redacted]w[redacted]d with an Estimated Date of Delivery: 09/24/21 being seen today for ongoing management of a low-risk pregnancy.  Depression screen [facility] 2/9 06/19/2021 03/16/2021 02/04/2021  Decreased Interest 0 1 2  Down, Depressed, Hopeless 0 0 0  PHQ - 2 Score 0 1 2  Altered sleeping 0 0 1  Tired, decreased energy 0 0 1  Change in appetite 0 0 1  Feeling bad or failure about yourself  0 0 0  Trouble concentrating 0 0 0  Moving slowly or fidgety/restless 0 1 0  Suicidal thoughts 0 0 0  PHQ-9 Score 0 2 5    Today she reports no complaints. Contractions: Not present. Vag. Bleeding: None.  Movement: Present. denies leaking of fluid. Review of Systems:   Pertinent items are noted in HPI Denies abnormal vaginal discharge w/ itching/odor/irritation, headaches, visual changes, shortness of breath, chest pain, abdominal pain, severe nausea/vomiting, or problems with urination or bowel movements unless otherwise stated above. Pertinent History Reviewed:  Reviewed past medical,surgical, social, obstetrical and family history.  Reviewed problem list, medications and allergies.  Physical Assessment:   Vitals:   09/07/21 0938  BP: 127/84  Pulse: 89  Weight: 258 lb 9.6 oz (117.3 kg)  Body mass index is 43.03 kg/m.        Physical Examination:   General appearance: Well appearing, and in no distress  Mental status: Alert, oriented to person, place, and time  Skin: Warm & dry  Respiratory: Normal respiratory effort, no distress  Abdomen: Soft, gravid, nontender  Pelvic: Cervical exam performed  Dilation: 1 Effacement (%): Thick Station: -3  Extremities: Edema: None  Psych:  mood and affect appropriate  Fetal Status: Fetal Heart Rate (bpm): 150 Fundal Height: 37 cm Movement: Present  Presentation: Vertex  Chaperone:  declined     No results found for this or any previous visit (from the past 24 hour(s)).   Assessment & Plan:  1) Low-risk pregnancy G1P0 at [redacted]w[redacted]d with an Estimated Date of Delivery: 09/24/21   GBS pos- PCN in labor    Meds: No orders of the defined types were placed in this encounter.  Labs/procedures today: none  Plan:  Continue routine obstetrical care Next visit: prefers in person    Reviewed: Term labor symptoms and general obstetric precautions including but not limited to vaginal bleeding, contractions, leaking of fluid and fetal movement were reviewed in detail with the patient.  All questions were answered. Pt has home bp cuff, but does not check her BP regularly.  Encouraged check bp weekly, let us know if >140/90.   Follow-up: Return in about 1 week (around 09/14/2021) for LROB visit.  No orders of the defined types were placed in this encounter.   Myna Hidalgo, DO Attending Obstetrician & Gynecologist, [facility] for [employer], [facility]

## 2021-09-14 ENCOUNTER — Other Ambulatory Visit: Payer: Self-pay

## 2021-09-14 ENCOUNTER — Ambulatory Visit (INDEPENDENT_AMBULATORY_CARE_PROVIDER_SITE_OTHER): Payer: Medicaid Other | Admitting: Obstetrics & Gynecology

## 2021-09-14 VITALS — BP 126/80 | HR 107 | Wt 255.0 lb

## 2021-09-14 DIAGNOSIS — B951 Streptococcus, group B, as the cause of diseases classified elsewhere: Secondary | ICD-10-CM

## 2021-09-14 DIAGNOSIS — Z3A38 38 weeks gestation of pregnancy: Secondary | ICD-10-CM

## 2021-09-14 DIAGNOSIS — Z3403 Encounter for supervision of normal first pregnancy, third trimester: Secondary | ICD-10-CM

## 2021-09-14 DIAGNOSIS — O98913 Unspecified maternal infectious and parasitic disease complicating pregnancy, third trimester: Secondary | ICD-10-CM

## 2021-09-14 NOTE — Progress Notes (Signed)
   LOW-RISK PREGNANCY VISIT Patient name: Tammy [name] MRN [id number]  Date of birth: 08-18-1991 Chief Complaint:   Routine Prenatal Visit  History of Present Illness:   Tammy [name] is a 30 y.o. G1P0 female at [redacted]w[redacted]d with an Estimated Date of Delivery: 09/24/21 being seen today for ongoing management of a low-risk pregnancy.   Depression screen [facility] 2/9 06/19/2021 03/16/2021 02/04/2021  Decreased Interest 0 1 2  Down, Depressed, Hopeless 0 0 0  PHQ - 2 Score 0 1 2  Altered sleeping 0 0 1  Tired, decreased energy 0 0 1  Change in appetite 0 0 1  Feeling bad or failure about yourself  0 0 0  Trouble concentrating 0 0 0  Moving slowly or fidgety/restless 0 1 0  Suicidal thoughts 0 0 0  PHQ-9 Score 0 2 5    Today she reports no complaints. Contractions: Irritability. Vag. Bleeding: None.  Movement: Present. denies leaking of fluid.  Pt had mentioned that someone previously spoke to her about an early delivery due to "narrow pelvis."  Review of Systems:   Pertinent items are noted in HPI Denies abnormal vaginal discharge w/ itching/odor/irritation, headaches, visual changes, shortness of breath, chest pain, abdominal pain, severe nausea/vomiting, or problems with urination or bowel movements unless otherwise stated above. Pertinent History Reviewed:  Reviewed past medical,surgical, social, obstetrical and family history.  Reviewed problem list, medications and allergies.  Physical Assessment:   Vitals:   09/14/21 1148  BP: 126/80  Pulse: (!) 107  Weight: 255 lb (115.7 kg)  Body mass index is 42.43 kg/m.        Physical Examination:   General appearance: Well appearing, and in no distress  Mental status: Alert, oriented to person, place, and time  Skin: Warm & dry  Respiratory: Normal respiratory effort, no distress  Abdomen: Soft, gravid, nontender  Pelvic: Cervical exam performed  Dilation: 1 Effacement (%): Thick Station: -3 narrow pelvis appreciated, difficulty with  exam  Extremities: Edema: None  Psych:  mood and affect appropriate  Fetal Status: Fetal Heart Rate (bpm): 15- Fundal Height: 38 cm Movement: Present Presentation: Vertex  Chaperone:  declined     No results found for this or any previous visit (from the past 24 hour(s)).   Assessment & Plan:  1) Low-risk pregnancy G1P0 at [redacted]w[redacted]d with an Estimated Date of Delivery: 09/24/21   2) GBS positive- collected to confirm sensitivities  3) Labor management -Discussed ARRIVE trial reviewed risk/benefit of elective IOL vs waiting spontaneous labor.  Pt is not sure, but leaning towards elective IOL -Scheduled for 11/2 @ 0000, if possible consider outpatient Foley at her next visit   Meds: No orders of the defined types were placed in this encounter.  Labs/procedures today: GBS  Plan:  Continue routine obstetrical care  Next visit: prefers in person    Reviewed: Term labor symptoms and general obstetric precautions including but not limited to vaginal bleeding, contractions, leaking of fluid and fetal movement were reviewed in detail with the patient.  All questions were answered. Pt has home bp cuff. Check bp weekly, let us know if >140/90.   Follow-up: Return in about 1 week (around 09/21/2021) for LROB visit.  Orders Placed This Encounter  Procedures   Strep Gp B NAA+Rflx    Myna Hidalgo, DO Attending Obstetrician & Gynecologist, [facility] for [employer], [facility]

## 2021-09-16 ENCOUNTER — Other Ambulatory Visit: Payer: Self-pay | Admitting: Advanced Practice Midwife

## 2021-09-17 ENCOUNTER — Telehealth (HOSPITAL_COMMUNITY): Payer: Self-pay | Admitting: *Deleted

## 2021-09-17 NOTE — Telephone Encounter (Signed)
Preadmission screen  

## 2021-09-18 ENCOUNTER — Telehealth (HOSPITAL_COMMUNITY): Payer: Self-pay | Admitting: *Deleted

## 2021-09-18 NOTE — Telephone Encounter (Signed)
Preadmission screen  

## 2021-09-20 LAB — STREP GP B SUSCEPTIBILITY

## 2021-09-20 LAB — STREP GP B NAA+RFLX: Strep Gp B NAA+Rflx: POSITIVE — AB

## 2021-09-22 ENCOUNTER — Encounter: Payer: Self-pay | Admitting: Women's Health

## 2021-09-22 ENCOUNTER — Other Ambulatory Visit: Payer: Self-pay | Admitting: Advanced Practice Midwife

## 2021-09-22 ENCOUNTER — Other Ambulatory Visit: Payer: Self-pay

## 2021-09-22 ENCOUNTER — Ambulatory Visit (INDEPENDENT_AMBULATORY_CARE_PROVIDER_SITE_OTHER): Payer: Medicaid Other | Admitting: Women's Health

## 2021-09-22 VITALS — BP 135/84 | HR 100 | Wt 259.0 lb

## 2021-09-22 DIAGNOSIS — Z3403 Encounter for supervision of normal first pregnancy, third trimester: Secondary | ICD-10-CM

## 2021-09-22 NOTE — Patient Instructions (Signed)
[place] [name], thank you for choosing our office today! We appreciate the opportunity to meet your healthcare needs. You may receive a short survey by mail, e-mail, or through [employer]. If you are happy with your care we would appreciate if you could take just a few minutes to complete the survey questions. We read all of your comments and take your feedback very seriously. Thank you again for choosing our office.  [name] for [employer] Team at [facility]  [place] [name] at [name] ([address] [place], [place] [address]) Entrance C, located off of [address] Free 24/7 valet parking   CLASSES: Go to [email or web address] to register for classes (childbirth, breastfeeding, waterbirth, infant CPR, daddy bootcamp, etc.)  Call the office [phone]) or go to [name] if: You begin to have strong, frequent contractions Your water breaks.  Sometimes it is a big gush of fluid, sometimes it is just a trickle that keeps getting your panties wet or running down your legs You have vaginal bleeding.  It is normal to have a small amount of spotting if your cervix was checked.  You don't feel your baby moving like normal.  If you don't, get you something to eat and drink and lay down and focus on feeling your baby move.   If your baby is still not moving like normal, you should call the office or go to [facility].  Call the office [phone]) or go to [name] for these signs of pre-eclampsia: Severe headache that does not go away with Tylenol Visual changes- seeing spots, double, blurred vision Pain under your right breast or upper abdomen that does not go away with Tums or heartburn [name] Nausea and/or vomiting Severe swelling in your hands, feet, and face   [place] [name] [facility] [place] [name]): [address] Dr. Colette Ribas, [phone]           [facility]: [address], [phone]                 [name]): [address], [phone] (call to ask if accepting patients) [facility] Department: [address], [place], [phone]    [place] [name]): [address], [phone] [name]: [address] [place], [phone] [name] [place]: [address] [place] [address], [phone]  [name] [name]  [place] [name]): [phone] [facility]: [address] [phone]   [place] [name]: [address], [phone]  [place] [name]  [place] [name]: [phone], [phone]  Home Blood Pressure Monitoring for Patients   Your provider has recommended that you check your blood pressure (BP) at least once a week at home. If you do not have a blood pressure cuff at home, one will be provided for you. Contact your provider if you have not received your monitor within 1 week.   Helpful Tips for Accurate Home Blood Pressure Checks  Don't smoke, exercise, or drink caffeine 30 minutes before checking your BP Use the restroom before checking your BP (a full bladder can raise your pressure) Relax in a comfortable upright chair Feet on the ground Left arm resting comfortably on a flat surface at the level of your heart Legs uncrossed Back supported Sit quietly and don't talk Place the cuff on your bare arm Adjust snuggly, so that only two fingertips  can fit between your skin and the top of the cuff Check 2 readings separated by at least one minute Keep a log of your BP readings For a visual, please reference this diagram: http://ccnc.care/bpdiagram  Provider Name: [facility]     Phone: [phone]  Zone 1: ALL CLEAR  Continue to monitor your symptoms:  BP reading is less than 140 (top number) or less than 90 (bottom number)  No right  upper stomach pain No headaches or seeing spots No feeling nauseated or throwing up No swelling in face and hands  Zone 2: CAUTION Call your doctor's office for any of the following:  BP reading is greater than 140 (top number) or greater than 90 (bottom number)  Stomach pain under your ribs in the middle or right side Headaches or seeing spots Feeling nauseated or throwing up Swelling in face and hands  Zone 3: EMERGENCY  Seek immediate medical care if you have any of the following:  BP reading is greater than160 (top number) or greater than 110 (bottom number) Severe headaches not improving with Tylenol Serious difficulty catching your breath Any worsening symptoms from Zone 2   Braxton Hicks Contractions Contractions of the uterus can occur throughout pregnancy, but they are not always a sign that you are in labor. You may have practice contractions called Braxton Hicks contractions. These false labor contractions are sometimes confused with true labor. What are [name] contractions? Braxton Hicks contractions are tightening movements that occur in the muscles of the uterus before labor. Unlike true labor contractions, these contractions do not result in opening (dilation) and thinning of the cervix. Toward the end of pregnancy (32-34 weeks), Braxton Hicks contractions can happen more often and may become stronger. These contractions are sometimes difficult to tell apart from true labor because they can be very uncomfortable. You should not feel embarrassed if you go to the [name] with false labor. Sometimes, the only way to tell if you are in true labor is for your health care provider to look for changes in the cervix. The health care provider will do a physical exam and may monitor your contractions. If you are not in true labor, the exam should show that your cervix is not dilating and your water has not broken. If there are no other health problems associated with your  pregnancy, it is completely safe for you to be sent home with false labor. You may continue to have Braxton Hicks contractions until you go into true labor. How to tell the difference between true labor and false labor True labor Contractions last 30-70 seconds. Contractions become very regular. Discomfort is usually felt in the top of the uterus, and it spreads to the lower abdomen and low back. Contractions do not go away with walking. Contractions usually become more intense and increase in frequency. The cervix dilates and gets thinner. False labor Contractions are usually shorter and not as strong as true labor contractions. Contractions are usually irregular. Contractions are often felt in the front of the lower abdomen and in the groin. Contractions may go away when you walk around or change positions while lying down. Contractions get weaker and are shorter-lasting as time goes on. The cervix usually does not dilate or become thin. Follow these instructions at home:  Take over-the-counter and prescription medicines only as told by your health care provider. Keep up with your usual exercises and follow other instructions from your health care provider. Eat and drink lightly if you think  you are going into labor. If Braxton Hicks contractions are making you uncomfortable: Change your position from lying down or resting to walking, or change from walking to resting. Sit and rest in a tub of warm water. Drink enough fluid to keep your urine pale yellow. Dehydration may cause these contractions. Do slow and deep breathing several times an hour. Keep all follow-up prenatal visits as told by your health care provider. This is important. Contact a health care provider if: You have a fever. You have continuous pain in your abdomen. Get help right away if: Your contractions become stronger, more regular, and closer together. You have fluid leaking or gushing from your vagina. You pass  blood-tinged mucus (bloody show). You have bleeding from your vagina. You have low back pain that you never had before. You feel your baby's head pushing down and causing pelvic pressure. Your baby is not moving inside you as much as it used to. Summary Contractions that occur before labor are called Braxton Hicks contractions, false labor, or practice contractions. Braxton Hicks contractions are usually shorter, weaker, farther apart, and less regular than true labor contractions. True labor contractions usually become progressively stronger and regular, and they become more frequent. Manage discomfort from [name] contractions by changing position, resting in a warm bath, drinking plenty of water, or practicing deep breathing. This information is not intended to replace advice given to you by your health care provider. Make sure you discuss any questions you have with your health care provider. Document Revised: 10/21/2017 Document Reviewed: 03/24/2017 Elsevier Patient Education  [place].

## 2021-09-22 NOTE — Progress Notes (Signed)
Patient contacted regarding induction of labor. Will be on hold until 09/23/21. Advised of reporting to MAU if DFM leaking fluid bleeding etc. Physician aware.

## 2021-09-22 NOTE — Progress Notes (Signed)
LOW-RISK PREGNANCY VISIT Patient name: [place] [name] MRN [id number]  Date of birth: October 22, 1991 Chief Complaint:   Routine Prenatal Visit  History of Present Illness:   [place] [name] is a 30 y.o. G1P0 female at [redacted]w[redacted]d with an Estimated Date of Delivery: 09/24/21 being seen today for ongoing management of a low-risk pregnancy.   Today she reports no complaints. Denies ha, ruq/epigastric pain, n/v. Sometimes sees stars if stands fast/hasn't eaten. Elective IOL scheduled last visit for [facility] tonight, plan was to place foley today. L&D currently has 5 IOLs holding, so will not place foley today.  Contractions: Irritability. Vag. Bleeding: None.  Movement: Present. denies leaking of fluid.  Depression screen [facility] 2/9 06/19/2021 03/16/2021 02/04/2021  Decreased Interest 0 1 2  Down, Depressed, Hopeless 0 0 0  PHQ - 2 Score 0 1 2  Altered sleeping 0 0 1  Tired, decreased energy 0 0 1  Change in appetite 0 0 1  Feeling bad or failure about yourself  0 0 0  Trouble concentrating 0 0 0  Moving slowly or fidgety/restless 0 1 0  Suicidal thoughts 0 0 0  PHQ-9 Score 0 2 5     GAD 7 : Generalized Anxiety Score 06/19/2021 03/16/2021 02/04/2021  Nervous, Anxious, on Edge 0 0 0  Control/stop worrying 0 0 1  Worry too much - different things 0 0 1  Trouble relaxing 0 0 1  Restless 0 0 0  Easily annoyed or irritable 0 1 2  Afraid - awful might happen 0 0 2  Total GAD 7 Score 0 1 7      Review of Systems:   Pertinent items are noted in HPI Denies abnormal vaginal discharge w/ itching/odor/irritation, headaches, visual changes, shortness of breath, chest pain, abdominal pain, severe nausea/vomiting, or problems with urination or bowel movements unless otherwise stated above. Pertinent History Reviewed:  Reviewed past medical,surgical, social, obstetrical and family history.  Reviewed problem list, medications and allergies. Physical Assessment:   Vitals:   09/22/21 0851  BP: 135/84  Pulse:  100  Weight: 259 lb (117.5 kg)  Body mass index is 43.1 kg/m.        Physical Examination:   General appearance: Well appearing, and in no distress  Mental status: Alert, oriented to person, place, and time  Skin: Warm & dry  Cardiovascular: Normal heart rate noted  Respiratory: Normal respiratory effort, no distress  Abdomen: Soft, gravid, nontender  Pelvic: Cervical exam performed  Dilation: 1 Effacement (%): Thick Station: -3  Extremities: Edema: None  Fetal Status: Fetal Heart Rate (bpm): 145 Fundal Height: 39 cm Movement: Present Presentation: Vertex  Chaperone: Faith Rogue   Urine dipstick: tr protein Assessment & Plan:  1) Low-risk pregnancy G1P0 at [redacted]w[redacted]d with an Estimated Date of Delivery: 09/24/21   2) Borderline bp, asymptomatic, tr proteinuria Has elective IOL scheduled for tonight at [facility], understands L&D currently holding 5 IOLs, so may not be tonight @ [facility] she gets to go in   Meds: No orders of the defined types were placed in this encounter.  Labs/procedures today: SVE    Reviewed: Term labor symptoms and general obstetric precautions including but not limited to vaginal bleeding, contractions, leaking of fluid and fetal movement were reviewed in detail with the patient.  All questions were answered.   Follow-up: Return for will schedule pp visit after delivery.  Future Appointments  Date Time Provider Department Center  09/23/2021 12:00 AM [facility] SCHED ROOM [facility] None  No orders of the defined types were placed in this encounter.  [place], [facility] 09/22/2021 9:30 AM

## 2021-09-23 ENCOUNTER — Inpatient Hospital Stay (HOSPITAL_COMMUNITY): Payer: Medicaid Other | Admitting: Anesthesiology

## 2021-09-23 ENCOUNTER — Inpatient Hospital Stay (HOSPITAL_COMMUNITY): Payer: Medicaid Other

## 2021-09-23 ENCOUNTER — Other Ambulatory Visit: Payer: Self-pay

## 2021-09-23 ENCOUNTER — Encounter (HOSPITAL_COMMUNITY): Payer: Self-pay | Admitting: Obstetrics and Gynecology

## 2021-09-23 ENCOUNTER — Inpatient Hospital Stay (HOSPITAL_COMMUNITY)
Admission: AD | Admit: 2021-09-23 | Discharge: 2021-09-26 | DRG: 788 | Disposition: A | Discharge status: Home / Self Care | Payer: Medicaid Other | Attending: Family Medicine | Admitting: Family Medicine

## 2021-09-23 DIAGNOSIS — Z98891 History of uterine scar from previous surgery: Secondary | ICD-10-CM

## 2021-09-23 DIAGNOSIS — O9902 Anemia complicating childbirth: Secondary | ICD-10-CM | POA: Diagnosis present

## 2021-09-23 DIAGNOSIS — Z7982 Long term (current) use of aspirin: Secondary | ICD-10-CM

## 2021-09-23 DIAGNOSIS — O480 Post-term pregnancy: Principal | ICD-10-CM | POA: Diagnosis present

## 2021-09-23 DIAGNOSIS — O99324 Drug use complicating childbirth: Secondary | ICD-10-CM | POA: Diagnosis present

## 2021-09-23 DIAGNOSIS — Z3400 Encounter for supervision of normal first pregnancy, unspecified trimester: Secondary | ICD-10-CM

## 2021-09-23 DIAGNOSIS — O26893 Other specified pregnancy related conditions, third trimester: Secondary | ICD-10-CM | POA: Diagnosis present

## 2021-09-23 DIAGNOSIS — O99824 Streptococcus B carrier state complicating childbirth: Secondary | ICD-10-CM | POA: Diagnosis present

## 2021-09-23 DIAGNOSIS — Z3A39 39 weeks gestation of pregnancy: Secondary | ICD-10-CM | POA: Diagnosis not present

## 2021-09-23 DIAGNOSIS — Z3A40 40 weeks gestation of pregnancy: Secondary | ICD-10-CM | POA: Diagnosis not present

## 2021-09-23 DIAGNOSIS — O368330 Maternal care for abnormalities of the fetal heart rate or rhythm, third trimester, not applicable or unspecified: Secondary | ICD-10-CM | POA: Diagnosis not present

## 2021-09-23 DIAGNOSIS — Z87891 Personal history of nicotine dependence: Secondary | ICD-10-CM

## 2021-09-23 DIAGNOSIS — F1290 Cannabis use, unspecified, uncomplicated: Secondary | ICD-10-CM | POA: Diagnosis present

## 2021-09-23 DIAGNOSIS — O134 Gestational [pregnancy-induced] hypertension without significant proteinuria, complicating childbirth: Principal | ICD-10-CM | POA: Diagnosis present

## 2021-09-23 DIAGNOSIS — Z3403 Encounter for supervision of normal first pregnancy, third trimester: Secondary | ICD-10-CM

## 2021-09-23 DIAGNOSIS — O99214 Obesity complicating childbirth: Secondary | ICD-10-CM | POA: Diagnosis present

## 2021-09-23 DIAGNOSIS — Z20822 Contact with and (suspected) exposure to covid-19: Secondary | ICD-10-CM | POA: Diagnosis present

## 2021-09-23 DIAGNOSIS — Z30017 Encounter for initial prescription of implantable subdermal contraceptive: Secondary | ICD-10-CM | POA: Diagnosis not present

## 2021-09-23 DIAGNOSIS — E669 Obesity, unspecified: Secondary | ICD-10-CM | POA: Diagnosis present

## 2021-09-23 DIAGNOSIS — Z3490 Encounter for supervision of normal pregnancy, unspecified, unspecified trimester: Secondary | ICD-10-CM | POA: Diagnosis present

## 2021-09-23 LAB — RESP PANEL BY RT-PCR (FLU A&B, COVID) ARPGX2
Influenza A by PCR: NEGATIVE
Influenza B by PCR: NEGATIVE
SARS Coronavirus 2 by RT PCR: NEGATIVE

## 2021-09-23 LAB — CBC
HCT: 37.3 % (ref 36.0–46.0)
Hemoglobin: 12.4 g/dL (ref 12.0–15.0)
MCH: 30 pg (ref 26.0–34.0)
MCHC: 33.2 g/dL (ref 30.0–36.0)
MCV: 90.3 fL (ref 80.0–100.0)
Platelets: 334 10*3/uL (ref 150–400)
RBC: 4.13 MIL/uL (ref 3.87–5.11)
RDW: 14.4 % (ref 11.5–15.5)
WBC: 8.8 10*3/uL (ref 4.0–10.5)
nRBC: 0 % (ref 0.0–0.2)

## 2021-09-23 LAB — TYPE AND SCREEN
ABO/RH(D): A POS
Antibody Screen: NEGATIVE

## 2021-09-23 MED ORDER — VANCOMYCIN HCL IN DEXTROSE 1-5 GM/200ML-% IV SOLN
1000.0000 mg | Freq: Two times a day (BID) | INTRAVENOUS | Status: DC
  Administered 2021-09-23 – 2021-09-24 (×2): 1000 mg via INTRAVENOUS
  Filled 2021-09-23 (×4): qty 200

## 2021-09-23 MED ORDER — LACTATED RINGERS IV SOLN: INTRAVENOUS | Status: DC

## 2021-09-23 MED ORDER — OXYTOCIN BOLUS FROM INFUSION: 333.0000 mL | Freq: Once | INTRAVENOUS | Status: DC

## 2021-09-23 MED ORDER — LACTATED RINGERS IV SOLN
500.0000 mL | INTRAVENOUS | Status: DC | PRN
  Administered 2021-09-23: 500 mL via INTRAVENOUS

## 2021-09-23 MED ORDER — TERBUTALINE SULFATE 1 MG/ML IJ SOLN: 0.2500 mg | Freq: Once | INTRAMUSCULAR | Status: DC | PRN

## 2021-09-23 MED ORDER — PHENYLEPHRINE 40 MCG/ML (10ML) SYRINGE FOR IV PUSH (FOR BLOOD PRESSURE SUPPORT): 80.0000 ug | PREFILLED_SYRINGE | INTRAVENOUS | Status: DC | PRN

## 2021-09-23 MED ORDER — OXYTOCIN-SODIUM CHLORIDE 30-0.9 UT/500ML-% IV SOLN: 2.5000 [IU]/h | INTRAVENOUS | Status: DC

## 2021-09-23 MED ORDER — TERBUTALINE SULFATE 1 MG/ML IJ SOLN
0.2500 mg | Freq: Once | INTRAMUSCULAR | Status: AC | PRN
  Administered 2021-09-24: 0.25 mg via SUBCUTANEOUS
  Filled 2021-09-23: qty 1

## 2021-09-23 MED ORDER — FENTANYL CITRATE (PF) 100 MCG/2ML IJ SOLN: 100.0000 ug | INTRAMUSCULAR | Status: DC | PRN

## 2021-09-23 MED ORDER — OXYCODONE-ACETAMINOPHEN 5-325 MG PO TABS: 1.0000 | ORAL_TABLET | ORAL | Status: DC | PRN

## 2021-09-23 MED ORDER — SOD CITRATE-CITRIC ACID 500-334 MG/5ML PO SOLN
30.0000 mL | ORAL | Status: DC | PRN
  Administered 2021-09-24: 30 mL via ORAL
  Filled 2021-09-23: qty 30

## 2021-09-23 MED ORDER — FENTANYL-BUPIVACAINE-NACL 0.5-0.125-0.9 MG/250ML-% EP SOLN
12.0000 mL/h | EPIDURAL | Status: DC | PRN
  Administered 2021-09-23: 12 mL/h via EPIDURAL
  Filled 2021-09-23: qty 250

## 2021-09-23 MED ORDER — LIDOCAINE HCL (PF) 1 % IJ SOLN: 30.0000 mL | INTRAMUSCULAR | Status: DC | PRN

## 2021-09-23 MED ORDER — OXYTOCIN-SODIUM CHLORIDE 30-0.9 UT/500ML-% IV SOLN
1.0000 m[IU]/min | INTRAVENOUS | Status: DC
  Administered 2021-09-23: 2 m[IU]/min via INTRAVENOUS
  Filled 2021-09-23: qty 500

## 2021-09-23 MED ORDER — ONDANSETRON HCL 4 MG/2ML IJ SOLN
4.0000 mg | Freq: Four times a day (QID) | INTRAMUSCULAR | Status: DC | PRN
  Administered 2021-09-23: 4 mg via INTRAVENOUS
  Filled 2021-09-23: qty 2

## 2021-09-23 MED ORDER — EPHEDRINE 5 MG/ML INJ: 10.0000 mg | INTRAVENOUS | Status: DC | PRN

## 2021-09-23 MED ORDER — ACETAMINOPHEN 325 MG PO TABS: 650.0000 mg | ORAL_TABLET | ORAL | Status: DC | PRN

## 2021-09-23 MED ORDER — FENTANYL CITRATE (PF) 100 MCG/2ML IJ SOLN
100.0000 ug | INTRAMUSCULAR | Status: DC | PRN
  Administered 2021-09-23 (×3): 100 ug via INTRAVENOUS
  Filled 2021-09-23 (×3): qty 2

## 2021-09-23 MED ORDER — MISOPROSTOL 25 MCG QUARTER TABLET
25.0000 ug | ORAL_TABLET | ORAL | Status: DC | PRN
  Filled 2021-09-23: qty 1

## 2021-09-23 MED ORDER — MISOPROSTOL 50MCG HALF TABLET
50.0000 ug | ORAL_TABLET | ORAL | Status: DC | PRN
  Administered 2021-09-23 (×2): 50 ug via BUCCAL
  Filled 2021-09-23: qty 1

## 2021-09-23 MED ORDER — LACTATED RINGERS IV SOLN: 500.0000 mL | Freq: Once | INTRAVENOUS | Status: DC

## 2021-09-23 MED ORDER — DIPHENHYDRAMINE HCL 50 MG/ML IJ SOLN: 12.5000 mg | INTRAMUSCULAR | Status: DC | PRN

## 2021-09-23 MED ORDER — OXYCODONE-ACETAMINOPHEN 5-325 MG PO TABS: 2.0000 | ORAL_TABLET | ORAL | Status: DC | PRN

## 2021-09-23 NOTE — H&P (Signed)
LABOR AND DELIVERY ADMISSION HISTORY AND PHYSICAL NOTE  T[place] [name] is a 30 y.o. female G1P0 with IUP at [redacted]w[redacted]d by 9 wk u/s presenting for elective induction of labor at term.   She reports positive fetal movement. She denies leakage of fluid or vaginal bleeding.  Prenatal History/Complications:  Past Medical History: Past Medical History:  Diagnosis Date   Chlamydia    Environmental allergies    Medical history non-contributory     Past Surgical History: Past Surgical History:  Procedure Laterality Date   wisdom teeth abstraction      Obstetrical History: OB History     Gravida  1   Para      Term      Preterm      AB      Living         SAB      IAB      Ectopic      Multiple      Live Births              Social History: Social History   Socioeconomic History   Marital status: Media planner    Spouse name: Not on file   Number of children: Not on file   Years of education: Not on file   Highest education level: Not on file  Occupational History   Not on file  Tobacco Use   Smoking status: Former    Types: Cigarettes   Smokeless tobacco: Never  Vaping Use   Vaping Use: Never used  Substance and Sexual Activity   Alcohol use: Not Currently   Drug use: Yes    Types: Marijuana    Comment: possible marijuana use   Sexual activity: Yes    Birth control/protection: None  Other Topics Concern   Not on file  Social History Narrative   Not on file   Social Determinants of Health   Financial Resource Strain: Low Risk    Difficulty of Paying Living Expenses: Not very hard  Food Insecurity: No Food Insecurity   Worried About Programme researcher, broadcasting/film/video in the Last Year: Never true   Ran Out of Food in the Last Year: Never true  Transportation Needs: No Transportation Needs   Lack of Transportation (Medical): No   Lack of Transportation (Non-Medical): No  Physical Activity: Sufficiently Active   Days of Exercise per Week: 4 days    Minutes of Exercise per Session: 40 min  Stress: No Stress Concern Present   Feeling of Stress : Only a little  Social Connections: Moderately Integrated   Frequency of Communication with Friends and Family: More than three times a week   Frequency of Social Gatherings with Friends and Family: More than three times a week   Attends Religious Services: 1 to 4 times per year   Active Member of [employer] or Organizations: No   Attends Banker Meetings: Never   Marital Status: Living with partner    Family History: Family History  Problem Relation Age of Onset   Hypertension Mother    Hyperthyroidism Mother    Diabetes Maternal Aunt    Cancer Maternal Uncle    Cancer Maternal Grandmother     Allergies: Allergies  Allergen Reactions   Carrot [Daucus Carota] Anaphylaxis   Penicillins Shortness Of Breath   Other     Carrots   Peach Flavor Hives    Medications Prior to Admission  Medication Sig Dispense Refill Last Dose   albuterol (  VENTOLIN HFA) 108 (90 Base) MCG/ACT inhaler Inhale 1-2 puffs into the lungs every 6 (six) hours as needed for wheezing or shortness of breath. 18 g 1 09/23/2021   aspirin 81 MG EC tablet Take 1 tablet (81 mg total) by mouth daily. Swallow whole. 90 tablet 3 Past Week   omeprazole (PRILOSEC) 20 MG capsule Take 1 capsule (20 mg total) by mouth daily. 1 tablet a day 30 capsule 6 09/22/2021   Prenatal Vit-Fe Fumarate-FA (GOODSENSE PRENATAL VITAMINS) 28-0.8 MG TABS Take 1 tablet by mouth daily. 30 tablet 11 Past Month     Review of Systems   All systems reviewed and negative except as stated in HPI  Last menstrual period 12/11/2020. General appearance: alert, cooperative, and appears stated age Lungs: clear to auscultation bilaterally Heart: regular rate and rhythm Abdomen: soft, non-tender; bowel sounds normal Extremities: No calf swelling or tenderness Presentation: cephalic by u/s Fetal monitoring: 150/mod/-a/-d Uterine activity:  quiet Dilation: 1 Effacement (%): Thick Station: -3 Exam by:: Dr. Ashok Pall   Prenatal labs: ABO, Rh: --/--/PENDING (11/02 1243) Antibody: PENDING (11/02 1243) Rubella: 4.06 (04/25 1214) RPR: Non Reactive (07/29 0850)  HBsAg: Negative (04/25 1214)  HIV: Non Reactive (07/29 0850)  GBS: --Lottie Dawson (10/24 1644)  2 hr Glucola: wnl Genetic screening:  wnl Anatomy US: wnl  Prenatal Transfer Tool  Maternal Diabetes: No Genetic Screening: Normal Maternal Ultrasounds/Referrals: Normal Fetal Ultrasounds or other Referrals:  None Maternal Substance Abuse:  Yes:  Type: Marijuana Significant Maternal Medications:  Meds include: Other:  aspirin Significant Maternal Lab Results: Group B Strep positive  Results for orders placed or performed during the hospital encounter of 09/23/21 (from the past 24 hour(s))  CBC   Collection Time: 09/23/21 12:43 PM  Result Value Ref Range   WBC 8.8 4.0 - 10.5 K/uL   RBC 4.13 3.87 - 5.11 MIL/uL   Hemoglobin 12.4 12.0 - 15.0 g/dL   HCT 26.8 34.1 - 96.2 %   MCV 90.3 80.0 - 100.0 fL   MCH 30.0 26.0 - 34.0 pg   MCHC 33.2 30.0 - 36.0 g/dL   RDW 22.9 79.8 - 92.1 %   Platelets 334 150 - 400 K/uL   nRBC 0.0 0.0 - 0.2 %  Type and screen   Collection Time: 09/23/21 12:43 PM  Result Value Ref Range   ABO/RH(D) PENDING    Antibody Screen PENDING    Sample Expiration      09/26/2021,2359 Performed at [facility], 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 G1P0 at [redacted]w[redacted]d here for elective induction of labor at term  #Labor: unfavorable cervix. Foley placed. Plan for buccal cytotec for ripening #Pain: Eventual epidural #FWB: Cat 1 #ID:  Gbs pos, no clinda sensitivities, will order vanc #MOF:  both #MOC: nexplanon inpatient #Circ:  N/a #obesity: bmi 43 noted  Terrisa Curfman B Juvon Teater 09/23/2021, 1:31 PM

## 2021-09-23 NOTE — Progress Notes (Signed)
Patient ID: Erick Colace, female   DOB: 04-20-1991, 30 y.o.   MRN: [id number]  Resting in bed after Fentanyl; s/p cytotec x 2 doses and foley balloon still in place  FHR 150-160s, +accels, occ variables Ctx irreg Cx was 3cm when check around balloon by LLK  IUP@39 .6wks IOL process  Plan to start Pit when foley comes out Anticipate vag del  Arabella Merles CNM 09/23/2021

## 2021-09-23 NOTE — Progress Notes (Signed)
T[place]my [name] is a 30 y.o. G1P0 at [redacted]w[redacted]d by 8 week ultrasound admitted for induction of labor due to Elective at term.  Subjective: Pt breathing with contractions, s/o at bedside for support.   Objective: LMP 12/11/2020 (Approximate)  No intake/output data recorded. No intake/output data recorded.  FHT:  FHR: 145 bpm, variability: moderate,  accelerations:  Present,  decelerations:  Absent UC:   regular, every 2-3 minutes SVE:   Dilation: 3 Effacement (%): Thick Station: -3 Exam by:: Misty Stanley Leftwich-kirby Foley balloon still in place, tension applied following cervical exam  Labs: Lab Results  Component Value Date   WBC 8.8 09/23/2021   HGB 12.4 09/23/2021   HCT 37.3 09/23/2021   MCV 90.3 09/23/2021   PLT 334 09/23/2021    Assessment / Plan: Induction of labor due to elective S/P Cytotec Foley in place  Labor: Progressing normally Preeclampsia:   n/a Fetal Wellbeing:  Category I Pain Control:  IV pain meds I/D:   GBS positive without sensitivities. Vancomycin Q 24 hours. Anticipated MOD:  NSVD  Sharen Counter 09/23/2021, 4:15 PM

## 2021-09-24 ENCOUNTER — Encounter (HOSPITAL_COMMUNITY): Payer: Self-pay | Admitting: Obstetrics and Gynecology

## 2021-09-24 ENCOUNTER — Encounter (HOSPITAL_COMMUNITY)
Admission: AD | Disposition: A | Discharge status: Home / Self Care | Payer: Self-pay | Source: Home / Self Care | Attending: Family Medicine

## 2021-09-24 DIAGNOSIS — Z3A40 40 weeks gestation of pregnancy: Secondary | ICD-10-CM

## 2021-09-24 DIAGNOSIS — Z98891 History of uterine scar from previous surgery: Secondary | ICD-10-CM

## 2021-09-24 DIAGNOSIS — O368330 Maternal care for abnormalities of the fetal heart rate or rhythm, third trimester, not applicable or unspecified: Secondary | ICD-10-CM

## 2021-09-24 LAB — COMPREHENSIVE METABOLIC PANEL
ALT: 15 U/L (ref 0–44)
AST: 16 U/L (ref 15–41)
Albumin: 2.6 g/dL — ABNORMAL LOW (ref 3.5–5.0)
Alkaline Phosphatase: 105 U/L (ref 38–126)
Anion gap: 8 (ref 5–15)
BUN: 6 mg/dL (ref 6–20)
CO2: 19 mmol/L — ABNORMAL LOW (ref 22–32)
Calcium: 8.8 mg/dL — ABNORMAL LOW (ref 8.9–10.3)
Chloride: 107 mmol/L (ref 98–111)
Creatinine, Ser: 0.65 mg/dL (ref 0.44–1.00)
GFR, Estimated: >60 mL/min (ref >60)
Glucose, Bld: 116 mg/dL — ABNORMAL HIGH (ref 70–99)
Potassium: 3.5 mmol/L (ref 3.5–5.1)
Sodium: 134 mmol/L — ABNORMAL LOW (ref 135–145)
Total Bilirubin: 0.9 mg/dL (ref 0.3–1.2)
Total Protein: 5.6 g/dL — ABNORMAL LOW (ref 6.5–8.1)

## 2021-09-24 LAB — CBC
HCT: 33.9 % — ABNORMAL LOW (ref 36.0–46.0)
Hemoglobin: 11.2 g/dL — ABNORMAL LOW (ref 12.0–15.0)
MCH: 29.6 pg (ref 26.0–34.0)
MCHC: 33 g/dL (ref 30.0–36.0)
MCV: 89.7 fL (ref 80.0–100.0)
Platelets: 277 10*3/uL (ref 150–400)
RBC: 3.78 MIL/uL — ABNORMAL LOW (ref 3.87–5.11)
RDW: 14.2 % (ref 11.5–15.5)
WBC: 15.6 10*3/uL — ABNORMAL HIGH (ref 4.0–10.5)
nRBC: 0 % (ref 0.0–0.2)

## 2021-09-24 LAB — PROTEIN / CREATININE RATIO, URINE
Creatinine, Urine: 105.88 mg/dL
Protein Creatinine Ratio: 0.17 mg/mg{Cre} — ABNORMAL HIGH (ref 0.00–0.15)
Total Protein, Urine: 18 mg/dL

## 2021-09-24 LAB — RPR: RPR Ser Ql: NONREACTIVE

## 2021-09-24 SURGERY — Surgical Case
Anesthesia: Epidural

## 2021-09-24 MED ORDER — COCONUT OIL OIL: 1.0000 "application " | TOPICAL_OIL | Status: DC | PRN

## 2021-09-24 MED ORDER — DIPHENHYDRAMINE HCL 50 MG/ML IJ SOLN
12.5000 mg | INTRAMUSCULAR | Status: DC | PRN
  Administered 2021-09-24: 12.5 mg via INTRAVENOUS
  Filled 2021-09-24: qty 1

## 2021-09-24 MED ORDER — OXYCODONE HCL 5 MG PO TABS
5.0000 mg | ORAL_TABLET | ORAL | Status: DC | PRN
  Administered 2021-09-25: 5 mg via ORAL
  Filled 2021-09-24: qty 1

## 2021-09-24 MED ORDER — ENOXAPARIN SODIUM 60 MG/0.6ML IJ SOSY
60.0000 mg | PREFILLED_SYRINGE | INTRAMUSCULAR | Status: DC
  Administered 2021-09-25 – 2021-09-26 (×2): 60 mg via SUBCUTANEOUS
  Filled 2021-09-24 (×2): qty 0.6

## 2021-09-24 MED ORDER — WITCH HAZEL-GLYCERIN EX PADS: 1.0000 "application " | MEDICATED_PAD | CUTANEOUS | Status: DC | PRN

## 2021-09-24 MED ORDER — METHYLERGONOVINE MALEATE 0.2 MG/ML IJ SOLN
INTRAMUSCULAR | Status: AC
  Filled 2021-09-24: qty 1

## 2021-09-24 MED ORDER — KETOROLAC TROMETHAMINE 30 MG/ML IJ SOLN
30.0000 mg | Freq: Four times a day (QID) | INTRAMUSCULAR | Status: AC
  Administered 2021-09-24 – 2021-09-25 (×4): 30 mg via INTRAVENOUS
  Filled 2021-09-24 (×4): qty 1

## 2021-09-24 MED ORDER — LIDOCAINE-EPINEPHRINE (PF) 2 %-1:200000 IJ SOLN
INTRAMUSCULAR | Status: DC | PRN
Start: 2021-09-24 — End: 2021-09-24
  Administered 2021-09-24 (×3): 5 mL via EPIDURAL

## 2021-09-24 MED ORDER — MEASLES, MUMPS & RUBELLA VAC IJ SOLR: 0.5000 mL | Freq: Once | INTRAMUSCULAR | Status: DC

## 2021-09-24 MED ORDER — TRANEXAMIC ACID-NACL 1000-0.7 MG/100ML-% IV SOLN
INTRAVENOUS | Status: DC | PRN
  Administered 2021-09-24: 1000 mg via INTRAVENOUS

## 2021-09-24 MED ORDER — OXYTOCIN-SODIUM CHLORIDE 30-0.9 UT/500ML-% IV SOLN: 1.0000 m[IU]/min | INTRAVENOUS | Status: DC

## 2021-09-24 MED ORDER — PHENYLEPHRINE 40 MCG/ML (10ML) SYRINGE FOR IV PUSH (FOR BLOOD PRESSURE SUPPORT)
PREFILLED_SYRINGE | INTRAVENOUS | Status: AC
  Filled 2021-09-24: qty 10

## 2021-09-24 MED ORDER — LACTATED RINGERS AMNIOINFUSION
INTRAVENOUS | Status: DC
  Administered 2021-09-24: 1000 mL via INTRAUTERINE

## 2021-09-24 MED ORDER — MEDROXYPROGESTERONE ACETATE 150 MG/ML IM SUSP: 150.0000 mg | INTRAMUSCULAR | Status: DC | PRN

## 2021-09-24 MED ORDER — SODIUM CHLORIDE 0.9 % IV SOLN
INTRAVENOUS | Status: AC
  Filled 2021-09-24: qty 500

## 2021-09-24 MED ORDER — ACETAMINOPHEN 10 MG/ML IV SOLN
INTRAVENOUS | Status: AC
  Filled 2021-09-24: qty 100

## 2021-09-24 MED ORDER — DIPHENHYDRAMINE HCL 25 MG PO CAPS
25.0000 mg | ORAL_CAPSULE | Freq: Four times a day (QID) | ORAL | Status: DC | PRN
  Administered 2021-09-25: 25 mg via ORAL
  Filled 2021-09-24: qty 1

## 2021-09-24 MED ORDER — ONDANSETRON HCL 4 MG/2ML IJ SOLN
INTRAMUSCULAR | Status: DC | PRN
  Administered 2021-09-24: 4 mg via INTRAVENOUS

## 2021-09-24 MED ORDER — PROMETHAZINE HCL 25 MG/ML IJ SOLN: 6.2500 mg | INTRAMUSCULAR | Status: DC | PRN

## 2021-09-24 MED ORDER — STERILE WATER FOR IRRIGATION IR SOLN
Status: DC | PRN
  Administered 2021-09-24: 1

## 2021-09-24 MED ORDER — OXYTOCIN-SODIUM CHLORIDE 30-0.9 UT/500ML-% IV SOLN: 2.5000 [IU]/h | INTRAVENOUS | Status: AC

## 2021-09-24 MED ORDER — FENTANYL CITRATE (PF) 100 MCG/2ML IJ SOLN
INTRAMUSCULAR | Status: AC
  Filled 2021-09-24: qty 2

## 2021-09-24 MED ORDER — MEPERIDINE HCL 25 MG/ML IJ SOLN: 6.2500 mg | INTRAMUSCULAR | Status: DC | PRN

## 2021-09-24 MED ORDER — DIBUCAINE (PERIANAL) 1 % EX OINT: 1.0000 "application " | TOPICAL_OINTMENT | CUTANEOUS | Status: DC | PRN

## 2021-09-24 MED ORDER — SODIUM CHLORIDE 0.9 % IV SOLN
500.0000 mg | INTRAVENOUS | Status: DC
  Administered 2021-09-24: 500 mg via INTRAVENOUS
  Filled 2021-09-24: qty 500

## 2021-09-24 MED ORDER — PHENYLEPHRINE 40 MCG/ML (10ML) SYRINGE FOR IV PUSH (FOR BLOOD PRESSURE SUPPORT)
PREFILLED_SYRINGE | INTRAVENOUS | Status: DC | PRN
  Administered 2021-09-24 (×2): 80 ug via INTRAVENOUS
  Administered 2021-09-24: 120 ug via INTRAVENOUS

## 2021-09-24 MED ORDER — LACTATED RINGERS IV SOLN: INTRAVENOUS | Status: DC

## 2021-09-24 MED ORDER — IBUPROFEN 600 MG PO TABS
600.0000 mg | ORAL_TABLET | Freq: Four times a day (QID) | ORAL | Status: DC
  Administered 2021-09-25 – 2021-09-26 (×4): 600 mg via ORAL
  Filled 2021-09-24 (×4): qty 1

## 2021-09-24 MED ORDER — NALBUPHINE HCL 10 MG/ML IJ SOLN: 5.0000 mg | INTRAMUSCULAR | Status: DC | PRN

## 2021-09-24 MED ORDER — LACTATED RINGERS IV SOLN: 500.0000 mL | Freq: Once | INTRAVENOUS | Status: DC

## 2021-09-24 MED ORDER — SODIUM CHLORIDE 0.9% FLUSH: 3.0000 mL | INTRAVENOUS | Status: DC | PRN

## 2021-09-24 MED ORDER — NALOXONE HCL 4 MG/10ML IJ SOLN
1.0000 ug/kg/h | INTRAVENOUS | Status: DC | PRN
  Filled 2021-09-24: qty 5

## 2021-09-24 MED ORDER — OXYTOCIN-SODIUM CHLORIDE 30-0.9 UT/500ML-% IV SOLN
INTRAVENOUS | Status: AC
  Filled 2021-09-24: qty 500

## 2021-09-24 MED ORDER — ACETAMINOPHEN 325 MG PO TABS
650.0000 mg | ORAL_TABLET | ORAL | Status: DC | PRN
  Administered 2021-09-26 (×2): 650 mg via ORAL
  Filled 2021-09-24 (×3): qty 2

## 2021-09-24 MED ORDER — NIFEDIPINE ER OSMOTIC RELEASE 30 MG PO TB24
30.0000 mg | ORAL_TABLET | Freq: Every day | ORAL | Status: DC
  Administered 2021-09-24 – 2021-09-26 (×3): 30 mg via ORAL
  Filled 2021-09-24 (×3): qty 1

## 2021-09-24 MED ORDER — FENTANYL CITRATE (PF) 100 MCG/2ML IJ SOLN
INTRAMUSCULAR | Status: DC | PRN
  Administered 2021-09-24: 100 ug via EPIDURAL

## 2021-09-24 MED ORDER — KETOROLAC TROMETHAMINE 30 MG/ML IJ SOLN: 30.0000 mg | Freq: Four times a day (QID) | INTRAMUSCULAR | Status: AC | PRN

## 2021-09-24 MED ORDER — ONDANSETRON HCL 4 MG/2ML IJ SOLN
INTRAMUSCULAR | Status: AC
  Filled 2021-09-24: qty 2

## 2021-09-24 MED ORDER — MORPHINE SULFATE (PF) 0.5 MG/ML IJ SOLN
INTRAMUSCULAR | Status: DC | PRN
  Administered 2021-09-24: 3 mg via EPIDURAL

## 2021-09-24 MED ORDER — OXYTOCIN-SODIUM CHLORIDE 30-0.9 UT/500ML-% IV SOLN
INTRAVENOUS | Status: DC | PRN
  Administered 2021-09-24: 300 mL via INTRAVENOUS

## 2021-09-24 MED ORDER — CLINDAMYCIN PHOSPHATE 900 MG/50ML IV SOLN
900.0000 mg | INTRAVENOUS | Status: AC
  Administered 2021-09-24: 900 mg via INTRAVENOUS

## 2021-09-24 MED ORDER — KETOROLAC TROMETHAMINE 30 MG/ML IJ SOLN: 30.0000 mg | Freq: Once | INTRAMUSCULAR | Status: DC

## 2021-09-24 MED ORDER — OXYCODONE HCL 5 MG PO TABS: 5.0000 mg | ORAL_TABLET | Freq: Once | ORAL | Status: DC | PRN

## 2021-09-24 MED ORDER — SIMETHICONE 80 MG PO CHEW
80.0000 mg | CHEWABLE_TABLET | Freq: Three times a day (TID) | ORAL | Status: DC
  Administered 2021-09-24 – 2021-09-26 (×4): 80 mg via ORAL
  Filled 2021-09-24 (×4): qty 1

## 2021-09-24 MED ORDER — KETOROLAC TROMETHAMINE 30 MG/ML IJ SOLN
INTRAMUSCULAR | Status: AC
  Filled 2021-09-24: qty 1

## 2021-09-24 MED ORDER — DIPHENHYDRAMINE HCL 25 MG PO CAPS: 25.0000 mg | ORAL_CAPSULE | ORAL | Status: DC | PRN

## 2021-09-24 MED ORDER — LIDOCAINE HCL (PF) 1 % IJ SOLN
INTRAMUSCULAR | Status: DC | PRN
  Administered 2021-09-23 (×2): 5 mL via EPIDURAL

## 2021-09-24 MED ORDER — SENNOSIDES-DOCUSATE SODIUM 8.6-50 MG PO TABS
2.0000 | ORAL_TABLET | Freq: Every day | ORAL | Status: DC
  Administered 2021-09-25 – 2021-09-26 (×2): 2 via ORAL
  Filled 2021-09-24 (×2): qty 2

## 2021-09-24 MED ORDER — SCOPOLAMINE 1 MG/3DAYS TD PT72: 1.0000 | MEDICATED_PATCH | Freq: Once | TRANSDERMAL | Status: DC

## 2021-09-24 MED ORDER — DEXTROSE 5 % IV SOLN: 1000.0000 mg | INTRAVENOUS | Status: DC

## 2021-09-24 MED ORDER — DEXAMETHASONE SODIUM PHOSPHATE 10 MG/ML IJ SOLN
INTRAMUSCULAR | Status: DC | PRN
  Administered 2021-09-24: 10 mg via INTRAVENOUS

## 2021-09-24 MED ORDER — KETOROLAC TROMETHAMINE 30 MG/ML IJ SOLN
30.0000 mg | Freq: Once | INTRAMUSCULAR | Status: AC | PRN
  Administered 2021-09-24: 30 mg via INTRAVENOUS

## 2021-09-24 MED ORDER — ACETAMINOPHEN 500 MG PO TABS
1000.0000 mg | ORAL_TABLET | Freq: Four times a day (QID) | ORAL | Status: AC
  Administered 2021-09-24 – 2021-09-25 (×4): 1000 mg via ORAL
  Filled 2021-09-24 (×4): qty 2

## 2021-09-24 MED ORDER — NALOXONE HCL 0.4 MG/ML IJ SOLN: 0.4000 mg | INTRAMUSCULAR | Status: DC | PRN

## 2021-09-24 MED ORDER — SIMETHICONE 80 MG PO CHEW: 80.0000 mg | CHEWABLE_TABLET | ORAL | Status: DC | PRN

## 2021-09-24 MED ORDER — PHENYLEPHRINE 40 MCG/ML (10ML) SYRINGE FOR IV PUSH (FOR BLOOD PRESSURE SUPPORT)
PREFILLED_SYRINGE | INTRAVENOUS | Status: AC
  Filled 2021-09-24: qty 30

## 2021-09-24 MED ORDER — OXYCODONE HCL 5 MG/5ML PO SOLN: 5.0000 mg | Freq: Once | ORAL | Status: DC | PRN

## 2021-09-24 MED ORDER — ACETAMINOPHEN 10 MG/ML IV SOLN
INTRAVENOUS | Status: DC | PRN
  Administered 2021-09-24: 1000 mg via INTRAVENOUS

## 2021-09-24 MED ORDER — METHYLERGONOVINE MALEATE 0.2 MG/ML IJ SOLN
INTRAMUSCULAR | Status: DC | PRN
  Administered 2021-09-24: .2 mg via INTRAMUSCULAR

## 2021-09-24 MED ORDER — NALBUPHINE HCL 10 MG/ML IJ SOLN
5.0000 mg | Freq: Once | INTRAMUSCULAR | Status: AC | PRN
  Administered 2021-09-24: 5 mg via INTRAVENOUS

## 2021-09-24 MED ORDER — NALBUPHINE HCL 10 MG/ML IJ SOLN
5.0000 mg | INTRAMUSCULAR | Status: DC | PRN
  Filled 2021-09-24: qty 1

## 2021-09-24 MED ORDER — GENTAMICIN SULFATE 40 MG/ML IJ SOLN
5.0000 mg/kg | INTRAVENOUS | Status: AC
  Administered 2021-09-24: 410 mg via INTRAVENOUS
  Filled 2021-09-24: qty 10.25

## 2021-09-24 MED ORDER — MORPHINE SULFATE (PF) 0.5 MG/ML IJ SOLN
INTRAMUSCULAR | Status: AC
  Filled 2021-09-24: qty 10

## 2021-09-24 MED ORDER — DEXAMETHASONE SODIUM PHOSPHATE 10 MG/ML IJ SOLN
INTRAMUSCULAR | Status: AC
  Filled 2021-09-24: qty 1

## 2021-09-24 MED ORDER — TETANUS-DIPHTH-ACELL PERTUSSIS 5-2.5-18.5 LF-MCG/0.5 IM SUSY: 0.5000 mL | PREFILLED_SYRINGE | Freq: Once | INTRAMUSCULAR | Status: DC

## 2021-09-24 MED ORDER — HYDROMORPHONE HCL 1 MG/ML IJ SOLN: 0.2500 mg | INTRAMUSCULAR | Status: DC | PRN

## 2021-09-24 MED ORDER — MENTHOL 3 MG MT LOZG: 1.0000 | LOZENGE | OROMUCOSAL | Status: DC | PRN

## 2021-09-24 MED ORDER — CLINDAMYCIN PHOSPHATE 900 MG/50ML IV SOLN
INTRAVENOUS | Status: AC
  Filled 2021-09-24: qty 50

## 2021-09-24 MED ORDER — PRENATAL MULTIVITAMIN CH
1.0000 | ORAL_TABLET | Freq: Every day | ORAL | Status: DC
  Administered 2021-09-25 – 2021-09-26 (×2): 1 via ORAL
  Filled 2021-09-24 (×2): qty 1

## 2021-09-24 MED ORDER — NALBUPHINE HCL 10 MG/ML IJ SOLN: 5.0000 mg | Freq: Once | INTRAMUSCULAR | Status: AC | PRN

## 2021-09-24 MED ORDER — ONDANSETRON HCL 4 MG/2ML IJ SOLN: 4.0000 mg | Freq: Three times a day (TID) | INTRAMUSCULAR | Status: DC | PRN

## 2021-09-24 SURGICAL SUPPLY — 29 items
BENZOIN TINCTURE PRP APPL 2/3 (GAUZE/BANDAGES/DRESSINGS) ×2 IMPLANT
CHLORAPREP W/TINT 26ML (MISCELLANEOUS) ×2 IMPLANT
CLIP FILSHIE TUBAL LIGA STRL (Clip) IMPLANT
CLOTH BEACON ORANGE TIMEOUT ST (SAFETY) ×2 IMPLANT
DRSG OPSITE POSTOP 4X10 (GAUZE/BANDAGES/DRESSINGS) ×2 IMPLANT
ELECT REM PT RETURN 9FT ADLT (ELECTROSURGICAL) ×2 IMPLANT
ELECTRODE REM PT RTRN 9FT ADLT (ELECTROSURGICAL) ×1 IMPLANT
GLOVE BIOGEL PI IND STRL 7.0 (GLOVE) ×3 IMPLANT
GLOVE BIOGEL PI INDICATOR 7.0 (GLOVE) ×3
GLOVE ECLIPSE 6.5 STRL STRAW (GLOVE) ×2 IMPLANT
GOWN STRL REUS W/ TWL LRG LVL3 (GOWN DISPOSABLE) ×2 IMPLANT
GOWN STRL REUS W/TWL LRG LVL3 (GOWN DISPOSABLE) ×4
NS IRRIG 1000ML POUR BTL (IV SOLUTION) ×2 IMPLANT
PAD OB MATERNITY 4.3X12.25 (PERSONAL CARE ITEMS) ×2 IMPLANT
PAD PREP 24X48 CUFFED NSTRL (MISCELLANEOUS) ×2 IMPLANT
RETAINER VISCERAL (MISCELLANEOUS) ×2 IMPLANT
RETRACTOR WND ALEXIS 25 LRG (MISCELLANEOUS) IMPLANT
RTRCTR WOUND ALEXIS 25CM LRG (MISCELLANEOUS) IMPLANT
STRIP CLOSURE SKIN 1/2X4 (GAUZE/BANDAGES/DRESSINGS) ×2 IMPLANT
SUT PLAIN 2 0 XLH (SUTURE) ×2 IMPLANT
SUT VIC AB 0 CT1 36 (SUTURE) ×4 IMPLANT
SUT VIC AB 0 CTX 36 (SUTURE) ×2
SUT VIC AB 0 CTX36XBRD ANBCTRL (SUTURE) ×1 IMPLANT
SUT VIC AB 2-0 CT1 27 (SUTURE) ×2
SUT VIC AB 2-0 CT1 TAPERPNT 27 (SUTURE) ×1 IMPLANT
SUT VIC AB 4-0 KS 27 (SUTURE) ×2 IMPLANT
TOWEL OR 17X24 6PK STRL BLUE (TOWEL DISPOSABLE) ×6 IMPLANT
TRAY FOLEY CATH SILVER 16FR (SET/KITS/TRAYS/PACK) ×2 IMPLANT
WATER STERILE IRR 1000ML POUR (IV SOLUTION) ×2 IMPLANT

## 2021-09-24 NOTE — Anesthesia Procedure Notes (Signed)
Epidural Patient location during procedure: OB Start time: 09/23/2021 11:43 PM End time: 09/23/2021 11:53 PM  Staffing Anesthesiologist: Achille Rich, MD Performed: anesthesiologist   Preanesthetic Checklist Completed: patient identified, IV checked, site marked, risks and benefits discussed, monitors and equipment checked, pre-op evaluation and timeout performed  Epidural Patient position: sitting Prep: DuraPrep Patient monitoring: heart rate, cardiac monitor, continuous pulse ox and blood pressure Approach: midline Location: L2-L3 Injection technique: LOR saline  Needle:  Needle type: Tuohy  Needle gauge: 17 G Needle length: 9 cm Needle insertion depth: 9 cm Catheter type: closed end flexible Catheter size: 19 Gauge Catheter at skin depth: 15 cm Test dose: negative and Other  Assessment Events: blood not aspirated, injection not painful, no injection resistance and negative IV test  Additional Notes Informed consent obtained prior to proceeding including risk of failure, 1% risk of PDPH, risk of minor discomfort and bruising.  Discussed rare but serious complications including epidural abscess, permanent nerve injury, epidural hematoma.  Discussed alternatives to epidural analgesia and patient desires to proceed.  Timeout performed pre-procedure verifying patient name, procedure, and platelet count.  Patient tolerated procedure well. Reason for block:procedure for pain

## 2021-09-24 NOTE — Lactation Note (Signed)
This note was copied from a baby's chart. Lactation Consultation Note  Patient Name: [place] [name] Today's Date: 09/24/2021 Reason for consult: Initial assessment;Primapara;1st time breastfeeding;Term Age:28 hours  Initial visit to 6 hours old infant of a P1 mother. Mother requests assistance with latch. Demonstrated hand expression , verbalized discomfort. Provided hand pump for colostrum expression, able to see 1 drop. Several attempts to latch, infant is tongue sucking. Noted cheek dimpling and compressed nipple. LC demonstrated sucking exercises. Infant is uninterested and sleepy.  Discussed normal newborn behavior and patterns, signs of good milk transfer, hunger cues, tummy size and benefits of skin to skin.   Plan: 1-Breastfeeding on demand or 8-12 times in 24h period. 2-Use manual pump as needed 3-Encouraged maternal rest, hydration and food intake.   Contact LC as needed for feeds/support/concerns/questions. All questions answered at this time. Provided Lactation services brochure and promoted INJoy booklet information.     Maternal Data Has patient been taught Hand Expression?: Yes Does the patient have breastfeeding experience prior to this delivery?: No  Feeding Mother's Current Feeding Choice: Breast Milk (plans to do bottle later)  LATCH Score Latch: Repeated attempts needed to sustain latch, nipple held in mouth throughout feeding, stimulation needed to elicit sucking reflex.  Audible Swallowing: None  Type of Nipple: Everted at rest and after stimulation  Comfort (Breast/Nipple): Soft / non-tender  Hold (Positioning): Assistance needed to correctly position infant at breast and maintain latch.  LATCH Score: 6   Lactation Tools Discussed/Used Tools: Pump;Flanges Flange Size: 24 Breast pump type: Manual Pump Education: Setup, frequency, and cleaning Reason for Pumping: stimulation and supplementation Pumping frequency: every 3h Pumped volume:  (1 drop  with demonstration)  Interventions Interventions: Breast feeding basics reviewed;Assisted with latch;Skin to skin;Breast massage;Hand express;Adjust position;Breast compression;Hand pump;Expressed milk;Position options;Support pillows;Education;LC Services brochure  Discharge Pump: Manual WIC Program: Yes  Consult Status Consult Status: Follow-up Date: 09/25/21 Follow-up type: In-patient    Xzavier Swinger A Higuera Ancidey 09/24/2021, 5:09 PM

## 2021-09-24 NOTE — Progress Notes (Addendum)
LABOR PROGRESS NOTE  Tammy [name] is a 30 y.o. G1P0 at [redacted]w[redacted]d  presented for elective IOL.   Subjective: Comfortable in bed.  Objective: BP 130/68   Pulse (!) 104   Temp 98.3 F (36.8 C) (Axillary)   Resp 16   Ht 5\' 5"  (1.651 m)   Wt 118.2 kg   LMP 12/11/2020 (Approximate)   SpO2 98%   BMI 43.37 kg/m  or  Vitals:   09/24/21 0020 09/24/21 0021 09/24/21 0022 09/24/21 0025  BP: (!) 156/98 (!) 156/98 (!) 156/98 130/68  Pulse: (!) 105 (!) 105 (!) 105 (!) 104  Resp:    16  Temp:      TempSrc:      SpO2: 98% 98% 98% 98%  Weight:      Height:        Dilation: 5 Effacement (%): 60 Station: -2 Presentation: Vertex Exam by:: [email or web address] Bodamer RN Fetal monitoring: Baseline: 160 bpm, Variability: Good {> 6 bpm), Accelerations: absent, and Decelerations: Absent Uterine activity: Frequency: Every 5 minutes  Labs: Lab Results  Component Value Date   WBC 15.6 (H) 09/24/2021   HGB 11.2 (L) 09/24/2021   HCT 33.9 (L) 09/24/2021   MCV 89.7 09/24/2021   PLT 277 09/24/2021    Patient Active Problem List   Diagnosis Date Noted   Encounter for elective induction of labor 09/23/2021   Obesity 07/03/2021   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 G1P0 at [redacted]w[redacted]d here for elective IOL.  Labor: Progressing well. AROM'd for light meconium. Pitocin 1x1 restarted.  Fetal Wellbeing:  Category I Pain Control:  Epidural Anticipated MOD:  Vaginal #GBS positive > Vanc gHTN: elevated blood pressures (150s/90s). Eclampsia labs drawn. Continue to monitor blood pressures.   [redacted]w[redacted]d, DO 09/24/2021, 2:13 AM PGY-1, [facility]

## 2021-09-24 NOTE — Progress Notes (Addendum)
Patient ID: Tammy [name], female   DOB: [birth year], 30 y.o.   MRN: [id number] Called by RN to report patient's elevated BPs; patient is asymptomatic (no HA, blurry vision, floating spots). Bps have been 140s/150s over 90s. She is diuresing well after her c/section (per Charity fundraiser). Will start on Procardia 30 XL. Discussed plan of care with RN; if patient develops severe range pressures or other concerning symptom, will draw pre-e labs and consider MgsO4.   Message sent to FT to schedule patient for follow up BP check in one week.   Luna Kitchens

## 2021-09-24 NOTE — Op Note (Signed)
Tammy [name] PROCEDURE DATE: 09/24/2021  PREOPERATIVE DIAGNOSES: Intrauterine pregnancy at [redacted]w[redacted]d weeks gestation; non-reassuring fetal status  POSTOPERATIVE DIAGNOSES: The same  PROCEDURE: Primary Low Transverse Cesarean Section   SURGEON:  Federico Flake MD  ASSISTANT:  Myna Hidalgo MD  An experienced assistant was required given the standard of surgical care given the complexity of the case and due to the lack of Fellow support on the day of surgery.  This assistant was needed for exposure, dissection, suctioning, retraction, instrument exchange, assisting with delivery with administration of fundal pressure, and for overall help during the procedure.   ANESTHESIOLOGY TEAM: Anesthesiologist: Lannie Fields, DO; Achille Rich, MD CRNA: Algis Greenhouse, CRNA; Kara Mead, CRNA  INDICATIONS: Tammy [name] is a 30 y.o. G1P1001 at [redacted]w[redacted]d here for cesarean section secondary to the indications listed under preoperative diagnoses; please see preoperative note for further details.  The risks of cesarean section were discussed with the patient including but were not limited to: bleeding which may require transfusion or reoperation; infection which may require antibiotics; injury to bowel, bladder, ureters or other surrounding organs; injury to the fetus; need for additional procedures including hysterectomy in the event of a life-threatening hemorrhage; placental abnormalities wth subsequent pregnancies, incisional problems, thromboembolic phenomenon and other postoperative/anesthesia complications.   The patient concurred with the proposed plan, giving informed written consent for the procedure.    FINDINGS:  Viable female infant in OP and asynclitic presentation, deep in pelvis.    Apgars  APGAR (1 MIN): 3   APGAR (5 MINS): 8   Cord pH: 7.044 Clear amniotic fluid.  Intact placenta, three vessel cord.  Normal uterus, fallopian tubes and ovaries bilaterally. Mild  uterine atony- received methergine and TXA intraoperatively  ANESTHESIA: Epidural  INTRAVENOUS FLUIDS: 1300 ml   ESTIMATED BLOOD LOSS: 460 ml URINE OUTPUT:  100 ml (transitioned from pink to clear during case) SPECIMENS: Placenta sent to L&D COMPLICATIONS: None immediate  PROCEDURE IN DETAIL:  The patient preoperatively received intravenous antibiotics and had sequential compression devices applied to her lower extremities.  She was then taken to the operating room where spinal anesthesia was administeredthe epidural anesthesia was dosed up to surgical level and was found to be adequate. She was then placed in a dorsal supine position with a leftward tilt, and prepped and draped in a sterile manner.  A foley catheter was placed into her bladder and attached to constant gravity.  After an adequate timeout was performed, a Pfannenstiel skin incision was made with scalpel carried through to the underlying layer of fascia. The fascia was incised in the midline, and this incision was extended bilaterally bluntly.  Superior aspect of the fascia was bluntly removed from  underlying rectus muscles .   The rectus muscles were separated in the midline and the peritoneum was entered bluntly. The Alexis self-retaining retractor was introduced into the abdominal cavity.  Attention was turned to the lower uterine segment where a low transverse hysterotomy was made with a scalpel and extended bilaterally bluntly.  The infant was successfully delivered in the typical fashion, the cord was clamped and cut after one minute, and the infant was handed over to the awaiting neonatology team. Uterine massage was then administered, and the placenta delivered intact with a three-vessel cord. The uterus was then cleared of clots and debris.  The hysterotomy was closed with 0 Vicryl in a running locked fashion, and an imbricating layer was also placed with 0 Vicryl.  One Figure-of-eight 0  Vicryl serosal stitches were placed to help  with hemostasis on the left hysterotomy.  The pelvis was cleared of all clot and debris. Hemostasis was confirmed on all surfaces.  The retractor was removed.  The peritoneum was closed with a 0 Vicryl purse string. The fascia was then closed using 0 Vicryl in a running fashion.  The subcutaneous layer was reapproximated with 2-0 plain gut in a running fashion, and the skin was closed with a 4-0 Vicryl subcuticular stitch. The patient tolerated the procedure well. Sponge, instrument and needle counts were correct x 3.  She was taken to the recovery room in stable condition.   Federico Flake, MD [facility]

## 2021-09-24 NOTE — Anesthesia Postprocedure Evaluation (Signed)
Anesthesia Post Note  Patient: Tammy [name]  Procedure(s) Performed: CESAREAN SECTION     Patient location during evaluation: PACU Anesthesia Type: Epidural Level of consciousness: awake and alert and oriented Pain management: pain level controlled Vital Signs Assessment: post-procedure vital signs reviewed and stable Respiratory status: spontaneous breathing, nonlabored ventilation and respiratory function stable Cardiovascular status: blood pressure returned to baseline and stable Postop Assessment: no headache, no backache, epidural receding and no apparent nausea or vomiting Anesthetic complications: no   No notable events documented.  Last Vitals:  Vitals:   09/24/21 1153 09/24/21 1200  BP: (!) 140/96 136/75  Pulse: 95 94  Resp: 16 17  Temp:    SpO2: 100% 100%    Last Pain:  Vitals:   09/24/21 1200  TempSrc:   PainSc: 0-No pain   Pain Goal:    LLE Motor Response: Purposeful movement (09/24/21 1200) LLE Sensation: Increased (09/24/21 1200) RLE Motor Response: Purposeful movement (09/24/21 1200) RLE Sensation: Increased (09/24/21 1200)     Epidural/Spinal Function Cutaneous sensation: Tingles (09/24/21 1200), Patient able to flex knees: No (09/24/21 1200), Patient able to lift hips off bed: No (09/24/21 1200), Back pain beyond tenderness at insertion site: No (09/24/21 1200), Progressively worsening motor and/or sensory loss: No (09/24/21 1200), Bowel and/or bladder incontinence post epidural: No (09/24/21 1200)  Lannie Fields

## 2021-09-24 NOTE — Progress Notes (Signed)
Patient ID: Tammy [name], female   DOB: [birth year], 30 y.o.   MRN: [id number]  S/p cervical foley; 2nd dose of cytotec <4h ago; feeling reg cramping and requesting Fentanyl  BPs 137/87, 149/92 FHR 160s, +accels, decreased LTV, occ variables Ctx irreg 2-5 mins Cx deferred presently  IUP@40 .0wks IOL process Elevated BPs  Check cx in about 30-57mins to determine plan of care (Pit vs expectant) Will continue to monitor BPs  Arabella Merles 09/22/2021

## 2021-09-24 NOTE — Progress Notes (Addendum)
Patient ID: Erick Colace, female   DOB: 1990/12/24, 30 y.o.   MRN: [id number]  Comfortable w epidural; had Pitocin running at 90mu/min from 0200-0320 when it was stopped for FHR late decels. Recovered until 0530 when an cx exam showed some additional effacement but the same dilation (5/70/-2)  BPs 130/62, 115/55 FHR 150s, intermittent +accels, occ variables, brady to 70s x 4 mins at 0540 Ctx not adequate without Pit Cx 5/70/vtx -2  Urine P/C: 0.17, nl LFTs, creat 0.65  IUP@40 .0wks gHTN IOL process FHR changes  Dr Alysia Penna in the room to discuss plan with pt and spouse; reviewed starting an amnioinfusion and letting her contract without Pitocin to see how the baby tolerates (as we were in the room she began having spont more freq ctx); may consider starting Pit 1x1 if the baby tolerates spont contractions; she and husband agree  Arabella Merles CNM 09/24/2021 6:19 AM

## 2021-09-24 NOTE — Anesthesia Preprocedure Evaluation (Signed)
Anesthesia Evaluation  Patient identified by MRN, date of birth, ID band Patient awake    Reviewed: Allergy & Precautions, H&P , NPO status , Patient's Chart, lab work & pertinent test results  Airway Mallampati: II   Neck ROM: full    Dental   Pulmonary former smoker,    breath sounds clear to auscultation       Cardiovascular negative cardio ROS   Rhythm:regular Rate:Normal     Neuro/Psych    GI/Hepatic   Endo/Other  Morbid obesity  Renal/GU      Musculoskeletal   Abdominal   Peds  Hematology   Anesthesia Other Findings   Reproductive/Obstetrics (+) Pregnancy                             Anesthesia Physical Anesthesia Plan  ASA: 2  Anesthesia Plan: Epidural   Post-op Pain Management:    Induction: Intravenous  PONV Risk Score and Plan: 2 and Treatment may vary due to age or medical condition  Airway Management Planned: Natural Airway  Additional Equipment:   Intra-op Plan:   Post-operative Plan:   Informed Consent: I have reviewed the patients History and Physical, chart, labs and discussed the procedure including the risks, benefits and alternatives for the proposed anesthesia with the patient or authorized representative who has indicated his/her understanding and acceptance.     Dental advisory given  Plan Discussed with: Anesthesiologist  Anesthesia Plan Comments:         Anesthesia Quick Evaluation

## 2021-09-24 NOTE — Transfer of Care (Signed)
Immediate Anesthesia Transfer of Care Note  Patient: Tammy [name]  Procedure(s) Performed: CESAREAN SECTION  Patient Location: PACU  Anesthesia Type:Epidural  Level of Consciousness: awake  Airway & Oxygen Therapy: Patient Spontanous Breathing  Post-op Assessment: Report given to RN  Post vital signs: Reviewed and stable  Last Vitals:  Vitals Value Taken Time  BP    Temp    Pulse    Resp 16 09/24/21 1130  SpO2    Vitals shown include unvalidated device data.  Last Pain:  Vitals:   09/24/21 0950  TempSrc: Axillary  PainSc:          Complications: No notable events documented.

## 2021-09-24 NOTE — Progress Notes (Addendum)
Patient ID: Tammy [name], female   DOB: June 17, 1991, 30 y.o.   MRN: [id number] Tammy [name] is a 30 y.o. G1P0 at [redacted]w[redacted]d admitted for induction of labor due to postdates  Subjective: comfortable with epidural  Objective: BP 120/60   Pulse 96   Temp 98.1 F (36.7 C) (Axillary)   Resp 16   Ht 5\' 5"  (1.651 m)   Wt 118.2 kg   LMP 12/11/2020 (Approximate)   SpO2 97%   BMI 43.37 kg/m  No intake/output data recorded.  FHR baseline  150 bpm, Variability: moderate, Accelerations:present, Decelerations:  Present  patient with late decelerations Toco: irregular, every 3-4 minutes   SVE:  6/80/0  Pitocin @ 0 mu/min  Labs: Lab Results  Component Value Date   WBC 15.6 (H) 09/24/2021   HGB 11.2 (L) 09/24/2021   HCT 33.9 (L) 09/24/2021   MCV 89.7 09/24/2021   PLT 277 09/24/2021    Assessment / Plan: Called to bedside to assess patient; Dr. 13/01/2021 at bedside as well. Variables resolved with repositioning; FSE placed. Dr. Alvester Morin is talking with patient about possible outcomes, including pCS.   Labor:  now 6 cm with more descent, hold pitocin and continue amnioinfusion-restart pitocin after 30 minutes if FH tracing is reassuring Fetal Wellbeing:  Category II Pain Control:  epidural Pre-eclampsia: bp's stable I/D:  Vancomycin for GBS+ Anticipated MOD: hopeful for vaginal birth  Alvester Morin CNM 09/24/2021, 9:40 AM

## 2021-09-24 NOTE — Progress Notes (Signed)
C-section Consent:  I was called to the room for fetal bradycardia to the 70-80s which resolved with position changes and placed a FSE. FHR rebounded to 140-150s. SVE 6/70/-3, caput present and infant feels asynclitic and possible ROP. Patient placed in crown position with peanut to help with fetal positioning.   I have discussed with patient my concerns with her fetal heart rate tracing and patient is amenable to C-section if needed.  I discussed with the patient that the indication would be fetal intolerance of labor.  Given that FHR, was appropriate currently and there was moderate variability I do not think we need to operate at the moment and I expressed that I have a guarded outlook.   The risks of cesarean section were discussed with the patient including but were not limited to: bleeding which may require transfusion or reoperation; infection which may require antibiotics; injury to bowel, bladder, ureters or other surrounding organs; injury to the fetus; need for additional procedures including hysterectomy in the event of a life-threatening hemorrhage; placental abnormalities wth subsequent pregnancies, incisional problems, thromboembolic phenomenon and other postoperative/anesthesia complications.  Patient was given a chance to ask questions and voiced understanding.  If we proceed to the OR she will need clinda/gent/azithromycin for surgical prophylaxis.   Federico Flake, MD, MPH, ABFM, [facility] Attending Physician Center for [facility]

## 2021-09-24 NOTE — Discharge Summary (Addendum)
Postpartum Discharge Summary  Patient Name: Tammy [name] DOB: [birth year] MRN: [id number]  Date of admission: 09/23/2021 Delivery date:09/24/2021  Delivering provider: Caren Macadam  Date of discharge: 09/26/2021  Admitting diagnosis: Encounter for elective induction of labor [Z34.90] Intrauterine pregnancy: [redacted]w[redacted]d    Secondary diagnosis:  Principal Problem:   S/P primary low transverse C-section Active Problems:   Supervision of normal first pregnancy   Marijuana use   Obesity   Encounter for elective induction of labor  Additional problems: Anemia   Discharge diagnosis: Term Pregnancy Delivered and Gestational Hypertension                                              Post partum procedures: Nexplanon Augmentation: AROM, Pitocin, Cytotec, and IP Foley Complications: None  Hospital course: Induction of Labor With Cesarean Section   30y.o. yo G1P0 at 438w0das admitted to the hospital 09/23/2021 for induction of labor. Patient had a labor course significant for fetal intolerance of labor at 6 cm. The patient went for cesarean section due to Non-Reassuring FHR. Delivery details are as follows: Membrane Rupture Time/Date: 1:48 AM ,09/24/2021   Delivery Method:C-Section, Low Transverse  Details of operation can be found in separate operative Note.  Patient had an uncomplicated postpartum course. She is ambulating, tolerating a regular diet, passing flatus, and urinating well.  Patient is discharged home in stable condition on 09/26/21.      Newborn Data: Birth date:09/24/2021  Birth time:10:44 AM  Gender:Female  Living status:Living  Apgars:3 ,8  Weight:3040 g                                Magnesium Sulfate received: No BMZ received: No Rhophylac:N/A MMR:No T-DaP:Given prenatally Flu: No Transfusion:No  Physical exam  Vitals:   09/25/21 0920 09/25/21 1416 09/25/21 2214 09/26/21 0558  BP: 117/62 125/69 122/70 129/76  Pulse: 100 99 (!) 109 92  Resp: _0 Temp: 97.8 F (36.6 C) 98 F (36.7 C) 98.4 F (36.9 C) 97.9 F (36.6 C)  TempSrc: Axillary Oral Oral Oral  SpO2: 100% 100% 100% 99%  Weight:      Height:       General: alert, cooperative, and no distress Lochia: appropriate Uterine Fundus: firm Incision: Healing well with no significant drainage DVT Evaluation: No evidence of DVT seen on physical exam. Labs: Lab Results  Component Value Date   WBC 17.1 (H) 09/25/2021   HGB 9.8 (L) 09/25/2021   HCT 29.2 (L) 09/25/2021   MCV 90.1 09/25/2021   PLT 252 09/25/2021   CMP Latest Ref Rng & Units 09/24/2021  Glucose 70 - 99 mg/dL 116(H)  BUN 6 - 20 mg/dL 6  Creatinine 0.44 - 1.00 mg/dL 0.65  Sodium 135 - 145 mmol/L 134(L)  Potassium 3.5 - 5.1 mmol/L 3.5  Chloride 98 - 111 mmol/L 107  CO2 22 - 32 mmol/L 19(L)  Calcium 8.9 - 10.3 mg/dL 8.8(L)  Total Protein 6.5 - 8.1 g/dL 5.6(L)  Total Bilirubin 0.3 - 1.2 mg/dL 0.9  Alkaline Phos 38 - 126 U/L 105  AST 15 - 41 U/L 16  ALT 0 - 44 U/L 15   Edinburgh Score: No flowsheet data found.    After visit meds:  Allergies as of 09/26/2021  Reactions   Carrot [daucus [place]] Anaphylaxis   Penicillins Shortness Of Breath   Other    Carrots   Peach Flavor Hives        Medication List     STOP taking these medications    aspirin 81 MG EC tablet       TAKE these medications    acetaminophen 325 MG tablet Commonly known as: TYLENOL Take 2 tablets (650 mg total) by mouth every 4 (four) hours as needed for mild pain (temperature > 101.5.).   albuterol 108 (90 Base) MCG/ACT inhaler Commonly known as: VENTOLIN HFA Inhale 1-2 puffs into the lungs every 6 (six) hours as needed for wheezing or shortness of breath.   GoodSense Prenatal Vitamins 28-0.8 MG Tabs Take 1 tablet by mouth daily.   ibuprofen 600 MG tablet Commonly known as: ADVIL Take 1 tablet (600 mg total) by mouth every 6 (six) hours.   NIFEdipine 30 MG 24 hr tablet Commonly known as: ADALAT  CC Take 1 tablet (30 mg total) by mouth daily.   omeprazole 20 MG capsule Commonly known as: PRILOSEC Take 1 capsule (20 mg total) by mouth daily. 1 tablet a day   oxyCODONE 5 MG immediate release tablet Commonly known as: Oxy IR/ROXICODONE Take 1 tablet (5 mg total) by mouth every 4 (four) hours as needed for moderate pain.         Discharge home in stable condition Infant Feeding: Bottle and Breast Infant Disposition:home with mother Discharge instruction: per After Visit Summary and Postpartum booklet. Activity: Advance as tolerated. Pelvic rest for 6 weeks.  Diet: routine diet Anticipated Birth Control: PP Nexplanon placed Postpartum Appointment:6 weeks Additional Postpartum F/U: Incision check 2wks and BP check 1 week Future Appointments: Future Appointments  Date Time Provider [place]  09/29/2021 11:10 AM [facility] NURSE [facility]  10/05/2021  1:50 PM Florian Buff, MD [facility]  10/30/2021 11:10 AM Roma Schanz, CNM [facility]   Follow up Visit:  Message sent to FT admin: Please schedule this patient for Postpartum visit in: 6 week with the following provider: any provider In-Person For C/S patients schedule nurse incision check in weeks 2 weeks: yes High risk pregnancy complicated by: gHTN in labor, unplanned CS 2/2 to fetal intolerance of labor Delivery mode:  CS Anticipated Birth Control:  PP Nexplanon placed PP Procedures needed: BP check  Edinburgh: negative Schedule Integrated [place] visit: no  No relevant baby issues   Wells Guiles, DO 09/26/2021, 8:30 AM PGY-1, [place] of Supervision of Student:  I confirm that I have verified the information documented in the  resident  student's note and that I have also personally reperformed the history, physical exam and all medical decision making activities.  I have verified that all services and findings are accurately documented in this student's  note; and I agree with management and plan as outlined in the documentation. I have also made any necessary editorial changes.  Wende Mott, [place] for [employer], [employer] 09/26/2021 9:05 AM

## 2021-09-24 NOTE — Lactation Note (Signed)
This note was copied from a baby's chart. Lactation Consultation Note  Patient Name: Tammy [name] Today's Date: 09/24/2021 Reason for consult: Follow-up assessment;Mother's request;Term;1st time breastfeeding;[place] Age:28 hours  RN called stating Mom needed assistance with feeding baby.   LC in to visit and Mom was very firm saying that she doesn't have any colostrum and her baby was hungry.  Mom requested formula and a bottle.  LC tried to explain gently about colostrum and breastfeeding but Mom repeated she didn't have anything to give her baby.  LC brought in a bottle for Mom to feed baby.   Asked Mom about pumping explaining the benefit of breast stimulation if baby is supplementing.  Initially, Mom stated ok but when LC came in to set up pump, Mom changed her mind.   Reassured Mom that feeding her baby was her choice.  Lactation is here to help support her efforts to breastfeed or establish breastfeeding.  If Mom chooses NOT to breastfeed, that is her choice. Explained to Mom that she can ask for assistance or for pump to be set up at any time.    Lactation Tools Discussed/Used Tools: Pump;Bottle Flange Size: 24 Breast pump type: Manual Pump Education: Setup, frequency, and cleaning Reason for Pumping: stimulation and supplementation Pumping frequency: every 3h Pumped volume:  (1 drop with demonstration)  Interventions Interventions: Skin to skin;Breast massage;Hand express;Education;Hand pump  Discharge Pump: Manual WIC Program: Yes  Consult Status Consult Status: PRN Date: 09/25/21 Follow-up type: In-patient    Tammy [name] 09/24/2021, 6:56 PM

## 2021-09-25 ENCOUNTER — Other Ambulatory Visit (HOSPITAL_COMMUNITY): Payer: Self-pay

## 2021-09-25 DIAGNOSIS — Z30017 Encounter for initial prescription of implantable subdermal contraceptive: Secondary | ICD-10-CM

## 2021-09-25 LAB — CBC
HCT: 29.2 % — ABNORMAL LOW (ref 36.0–46.0)
Hemoglobin: 9.8 g/dL — ABNORMAL LOW (ref 12.0–15.0)
MCH: 30.2 pg (ref 26.0–34.0)
MCHC: 33.6 g/dL (ref 30.0–36.0)
MCV: 90.1 fL (ref 80.0–100.0)
Platelets: 252 10*3/uL (ref 150–400)
RBC: 3.24 MIL/uL — ABNORMAL LOW (ref 3.87–5.11)
RDW: 14.4 % (ref 11.5–15.5)
WBC: 17.1 10*3/uL — ABNORMAL HIGH (ref 4.0–10.5)
nRBC: 0 % (ref 0.0–0.2)

## 2021-09-25 MED ORDER — OXYCODONE HCL 5 MG PO TABS
5.0000 mg | ORAL_TABLET | ORAL | 0 refills | Status: DC | PRN
Start: 2021-09-25 — End: 2021-10-30
  Filled 2021-09-25: qty 20, 4d supply, fill #0

## 2021-09-25 MED ORDER — NIFEDIPINE ER 30 MG PO TB24
30.0000 mg | ORAL_TABLET | Freq: Every day | ORAL | 0 refills | Status: DC
  Filled 2021-09-25: qty 30, 30d supply, fill #0

## 2021-09-25 MED ORDER — IBUPROFEN 600 MG PO TABS
600.0000 mg | ORAL_TABLET | Freq: Four times a day (QID) | ORAL | 0 refills | Status: DC
  Filled 2021-09-25: qty 30, 8d supply, fill #0

## 2021-09-25 MED ORDER — LIDOCAINE HCL 1 % IJ SOLN
0.0000 mL | Freq: Once | INTRAMUSCULAR | Status: AC | PRN
  Administered 2021-09-25: 5 mL via INTRADERMAL
  Filled 2021-09-25: qty 20

## 2021-09-25 MED ORDER — ETONOGESTREL 68 MG ~~LOC~~ IMPL
68.0000 mg | DRUG_IMPLANT | Freq: Once | SUBCUTANEOUS | Status: AC
  Administered 2021-09-25: 68 mg via SUBCUTANEOUS
  Filled 2021-09-25: qty 1

## 2021-09-25 NOTE — Progress Notes (Signed)
Post-Op Day 1, primary CS for NRFHT  Subjective: No complaints, up ad lib, voiding and tolerating PO, passing flatus,small lochia,.plans to breastfeed,  plans nexplanon  Objective: Blood pressure 112/60, pulse 91, temperature 97.9 F (36.6 C), temperature source Oral, resp. rate 16, height 5\' 5"  (1.651 m), weight 118.2 kg, last menstrual period 12/11/2020, SpO2 98 %, unknown if currently breastfeeding.  Physical Exam:  General: alert, cooperative and no distress Lochia:normal flow Chest: CTAB Heart: RRR no m/r/g Abdomen: +BS, soft, nontender, dsg c/d/intact, no erythema Uterine Fundus: firm DVT Evaluation: No evidence of DVT seen on physical exam. Extremities: trace edema  Recent Labs    09/24/21 0136 09/25/21 0554  HGB 11.2* 9.8*  HCT 33.9* 29.2*    Assessment/Plan: GHTN, on procardia. Meds to Beds/BBsx ordered Declined Venofer   LOS: 2 days   13/04/22 09/25/2021, 8:06 AM

## 2021-09-25 NOTE — Procedures (Signed)
PRE-OP DIAGNOSIS: desired long-term, reversible contraception   POST-OP DIAGNOSIS: Same   PROCEDURE: Nexplanon  placement  Performing Physician: Shonna Chock, MD   PROCEDURE:  Written informed consent obtained Site (check): left arm        Sterile Preparation:    [x]       Betadine        [_]     Chloraprep             After a time-out, insertion site was selected 6 - 10 cm from medial epicondyle and marked. Procedure area was prepped and draped in a sterile fashion. 3 mL of 1% lidocaine w/o epinephrine was used for subcutaneous anesthesia. Anesthesia confirmed.  Nexplanon  trocar was inserted subcutaneously and then Nexplanon  capsule delivered subcutaneously. Trocar was removed from the insertion site. Nexplanon  capsule was palpated by provider and patient to assure satisfactory placement.  Estimated blood loss: minimal Dressings applied: steri-strip, band-aid, coband Followup: The patient tolerated the procedure well without complications.  Standard post-procedure care wass explained and return precautions were given.

## 2021-09-26 MED ORDER — ACETAMINOPHEN 325 MG PO TABS: 650.0000 mg | ORAL_TABLET | ORAL | 0 refills | Status: AC | PRN

## 2021-09-26 NOTE — Progress Notes (Signed)
Nurse at Bedside

## 2021-09-28 ENCOUNTER — Other Ambulatory Visit (HOSPITAL_COMMUNITY): Payer: Self-pay

## 2021-09-29 ENCOUNTER — Other Ambulatory Visit: Payer: Self-pay

## 2021-09-29 ENCOUNTER — Encounter: Payer: Self-pay | Admitting: *Deleted

## 2021-09-29 ENCOUNTER — Ambulatory Visit (INDEPENDENT_AMBULATORY_CARE_PROVIDER_SITE_OTHER): Payer: Medicaid Other | Admitting: *Deleted

## 2021-09-29 VITALS — BP 130/91 | HR 97 | Ht 65.0 in | Wt 258.0 lb

## 2021-09-29 DIAGNOSIS — Z0130 Encounter for examination of blood pressure without abnormal findings: Secondary | ICD-10-CM

## 2021-09-29 NOTE — Progress Notes (Addendum)
   NURSE VISIT- BLOOD PRESSURE CHECK  SUBJECTIVE:  Tammy [name] is a 30 y.o. G48P1001 female here for BP check. She is postpartum, delivery date 09/24/21.     HYPERTENSION ROS:  Pregnant/postpartum:  Severe headaches that don't go away with tylenol/other medicines: No  Visual changes (seeing spots/double/blurred vision) No  Severe pain under right breast breast or in center of upper chest No  Severe nausea/vomiting No  Taking medicines as instructed yes    OBJECTIVE:  BP (!) 130/91 (BP Location: Left Arm, Patient Position: Sitting, Cuff Size: Large)   Pulse 97   Ht 5\' 5"  (1.651 m)   Wt 258 lb (117 kg)   LMP 12/11/2020 (Approximate)   Breastfeeding No   BMI 42.93 kg/m  BP reading was 131/93 pulse 97 Appearance alert, well appearing, and in no distress.  ASSESSMENT: Postpartum  blood pressure check  PLAN: Discussed with 12/13/2020, MD   Recommendations: no changes needed   Follow-up: as scheduled   Turner Daniels  09/29/2021 10:34 AM  Attestation of Attending Supervision of Nursing Visit Encounter: Evaluation and management procedures were performed by the nursing staff under my supervision and collaboration.  I have reviewed the nurse's note and chart, and I agree with the management and plan.  13/06/2021 MD Attending Physician for the Center for [facility] 10/02/2021 10:01 AM

## 2021-10-05 ENCOUNTER — Ambulatory Visit (INDEPENDENT_AMBULATORY_CARE_PROVIDER_SITE_OTHER): Payer: Medicaid Other | Admitting: Obstetrics & Gynecology

## 2021-10-05 ENCOUNTER — Encounter: Payer: Self-pay | Admitting: Obstetrics & Gynecology

## 2021-10-05 VITALS — BP 118/74 | HR 91 | Ht 65.0 in | Wt 251.0 lb

## 2021-10-05 DIAGNOSIS — Z98891 History of uterine scar from previous surgery: Secondary | ICD-10-CM

## 2021-10-05 NOTE — Progress Notes (Signed)
  HPI: Patient returns for routine postoperative follow-up having undergone LTCS on 09/24/21.  The patient's immediate postoperative recovery has been unremarkable. Since hospital discharge the patient reports no problems.   Current Outpatient Medications: acetaminophen (TYLENOL) 325 MG tablet, Take 2 tablets (650 mg total) by mouth every 4 (four) hours as needed for mild pain (temperature > 101.5.)., Disp: 30 tablet, Rfl: 0 albuterol (VENTOLIN HFA) 108 (90 Base) MCG/ACT inhaler, Inhale 1-2 puffs into the lungs every 6 (six) hours as needed for wheezing or shortness of breath., Disp: 18 g, Rfl: 1 etonogestrel (NEXPLANON) 68 MG IMPL implant, 1 each by Subdermal route once., Disp: , Rfl:  ibuprofen (ADVIL) 600 MG tablet, Take 1 tablet (600 mg total) by mouth every 6 (six) hours., Disp: 30 tablet, Rfl: 0 NIFEdipine (ADALAT CC) 30 MG 24 hr tablet, Take 1 tablet (30 mg total) by mouth daily., Disp: 90 tablet, Rfl: 0 oxyCODONE (OXY IR/ROXICODONE) 5 MG immediate release tablet, Take 1 tablet (5 mg total) by mouth every 4 (four) hours as needed for moderate pain. (Patient not taking: No sig reported), Disp: 20 tablet, Rfl: 0  No current facility-administered medications for this visit.    Blood pressure 118/74, pulse 91, height 5\' 5"  (1.651 m), weight 251 lb (113.9 kg), not currently breastfeeding.  Physical Exam: Incision clean dry intact Abbdomen is benign normal post op  Diagnostic Tests:   Pathology:   Impression:   ICD-10-CM   1. S/P cesarean section  09/24/21  Z98.891         Plan: Return for keep scheduled. Has nexplanon in left arm      Follow up:    13/3/22, MD

## 2021-10-08 ENCOUNTER — Telehealth (HOSPITAL_COMMUNITY): Payer: Self-pay | Admitting: *Deleted

## 2021-10-08 NOTE — Telephone Encounter (Signed)
Unable to place call due to phone not working.  Duffy Rhody, [place] 10-08-2021 at 11:28am

## 2021-10-30 ENCOUNTER — Encounter: Payer: Self-pay | Admitting: Women's Health

## 2021-10-30 ENCOUNTER — Other Ambulatory Visit: Payer: Self-pay

## 2021-10-30 ENCOUNTER — Ambulatory Visit (INDEPENDENT_AMBULATORY_CARE_PROVIDER_SITE_OTHER): Payer: Medicaid Other | Admitting: Women's Health

## 2021-10-30 DIAGNOSIS — Z98891 History of uterine scar from previous surgery: Secondary | ICD-10-CM

## 2021-10-30 DIAGNOSIS — Z30017 Encounter for initial prescription of implantable subdermal contraceptive: Secondary | ICD-10-CM | POA: Insufficient documentation

## 2021-10-30 NOTE — Progress Notes (Signed)
POSTPARTUM VISIT Patient name: Tammy [name] MRN [id number]  Date of birth: 07-28-1991 Chief Complaint:   Postpartum Care  History of Present Illness:   Tammy [name] is a 30 y.o. G50P1001 African American female being seen today for a postpartum visit. She is 5 weeks postpartum following a primary cesarean section, low transverse incision for NRFHR at 40.0 gestational weeks. IOL: yes, for elective. Anesthesia: epidural.  Laceration: n/a.  Complications: none. Inpatient contraception: yes Nexplanon inserted 11/4 .   Pregnancy uncomplicated. Tobacco use: former . Substance use disorder: no. Last pap smear: 01/08/21 and results were negative per pt report at [facility] . Next pap smear due: 2025 No LMP recorded.  Postpartum course has been uncomplicated. Bleeding none. Bowel function is normal. Bladder function is normal. Urinary incontinence? no, fecal incontinence? no Patient is not sexually active. Last sexual activity: prior to birth of baby. Desired contraception: PP Nexplanon placed. Patient does not want a pregnancy in the future.  Desired family size is 1 children.   Upstream - 10/30/21 1051       Pregnancy Intention Screening   Does the patient want to become pregnant in the next year? No    Does the patient's partner want to become pregnant in the next year? No    Would the patient like to discuss contraceptive options today? No      Contraception Wrap Up   Current Method Hormonal Implant    End Method Hormonal Implant            The pregnancy intention screening data noted above was reviewed. Potential methods of contraception were discussed. The patient elected to proceed with Hormonal Implant.  Edinburgh Postpartum Depression Screening: negative  Edinburgh Postnatal Depression Scale - 10/30/21 1050       Edinburgh Postnatal Depression Scale:  In the Past 7 Days   I have been able to laugh and see the funny side of things. 0    I have looked forward with enjoyment  to things. 0    I have blamed myself unnecessarily when things went wrong. 0    I have been anxious or worried for no good reason. 0    I have felt scared or panicky for no good reason. 1    Things have been getting on top of me. 0    I have been so unhappy that I have had difficulty sleeping. 0    I have felt sad or miserable. 0    I have been so unhappy that I have been crying. 0    The thought of harming myself has occurred to me. 0    Edinburgh Postnatal Depression Scale Total 1             GAD 7 : Generalized Anxiety Score 06/19/2021 03/16/2021 02/04/2021  Nervous, Anxious, on Edge 0 0 0  Control/stop worrying 0 0 1  Worry too much - different things 0 0 1  Trouble relaxing 0 0 1  Restless 0 0 0  Easily annoyed or irritable 0 1 2  Afraid - awful might happen 0 0 2  Total GAD 7 Score 0 1 7     Baby's course has been uncomplicated. Baby is feeding by bottle. Infant has a pediatrician/family doctor? Yes.  Childcare strategy if returning to work/school: family.  Pt has material needs met for her and baby: Yes.   Review of Systems:   Pertinent items are noted in HPI Denies Abnormal vaginal discharge w/ itching/odor/irritation, headaches,  visual changes, shortness of breath, chest pain, abdominal pain, severe nausea/vomiting, or problems with urination or bowel movements. Pertinent History Reviewed:  Reviewed past medical,surgical, obstetrical and family history.  Reviewed problem list, medications and allergies. OB History  Gravida Para Term Preterm AB Living  1 1 1     1   SAB IAB Ectopic Multiple Live Births        0 1    # Outcome Date GA Lbr Len/2nd Weight Sex Delivery Anes PTL Lv  1 Term 09/24/21 102w0d 6 lb 11.2 oz (3.04 kg) F CS-LTranv EPI  LIV     Birth Comments: wnl   Physical Assessment:   Vitals:   10/30/21 1047  BP: 101/70  Pulse: 84  Weight: 252 lb (114.3 kg)  Height: 5' 5"  (1.651 m)  Body mass index is 41.93 kg/m.       Physical Examination:    General appearance: alert, well appearing, and in no distress  Mental status: alert, oriented to person, place, and time  Skin: warm & dry   Cardiovascular: normal heart rate noted   Respiratory: normal respiratory effort, no distress   Breasts: deferred, no complaints   Abdomen: soft, non-tender, c/s incision well-healed  Pelvic: examination not indicated. Thin prep pap obtained: No  Rectal: not examined  Extremities: Edema: none   Chaperone: N/A         No results found for this or any previous visit (from the past 24 hour(s)).  Assessment & Plan:  1) Postpartum exam 2) 5 wks s/p primary cesarean section, low transverse incision for NRFHR during elective IOL 3) bottle feeding 4) Depression screening 5) Contraception s/p Nexplanon 11/4  Essential components of care per ACOG recommendations:  1.  Mood and well being:  If positive depression screen, discussed and plan developed.  If using tobacco we discussed reduction/cessation and risk of relapse If current substance abuse, we discussed and referral to local resources was offered.   2. Infant care and feeding:  If breastfeeding, discussed returning to work, pumping, breastfeeding-associated pain, guidance regarding return to fertility while lactating if not using another method. If needed, patient was provided with a letter to be allowed to pump q 2-3hrs to support lactation in a private location with access to a refrigerator to store breastmilk.   Recommended that all caregivers be immunized for flu, pertussis and other preventable communicable diseases If pt does not have material needs met for her/baby, referred to local resources for help obtaining these.  3. Sexuality, contraception and birth spacing Provided guidance regarding sexuality, management of dyspareunia, and resumption of intercourse Discussed avoiding interpregnancy interval <643ms and recommended birth spacing of 18 months  4. Sleep and fatigue Discussed  coping options for fatigue and sleep disruption Encouraged family/partner/community support of 4 hrs of uninterrupted sleep to help with mood and fatigue  5. Physical recovery  If pt had a C/S, assessed incisional pain and providing guidance on normal vs prolonged recovery If pt had a laceration, perineal healing and pain reviewed.  If urinary or fecal incontinence, discussed management and referred to PT or uro/gyn if indicated  Patient is safe to resume physical activity. Discussed attainment of healthy weight.  6.  Chronic disease management Discussed pregnancy complications if any, and their implications for future childbearing and long-term maternal health. Review recommendations for prevention of recurrent pregnancy complications, such as 17 hydroxyprogesterone caproate to reduce risk for recurrent PTB not applicable, or aspirin to reduce risk of preeclampsia not applicable. Pt had  GDM: no. If yes, 2hr GTT scheduled: not applicable. Reviewed medications and non-pregnant dosing including consideration of whether pt is breastfeeding using a reliable resource such as LactMed: not applicable Referred for f/u w/ PCP or subspecialist providers as indicated: not applicable  7. Health maintenance Mammogram at 28yo or earlier if indicated Pap smears as indicated  Meds: No orders of the defined types were placed in this encounter.   Follow-up: Return in about 1 year (around 10/30/2022) for Physical.   No orders of the defined types were placed in this encounter.   [place], [facility] 10/30/2021 11:14 AM

## 2021-11-29 ENCOUNTER — Ambulatory Visit
Admission: EM | Admit: 2021-11-29 | Discharge: 2021-11-29 | Disposition: A | Discharge status: Home / Self Care | Payer: Medicaid Other

## 2021-11-29 ENCOUNTER — Other Ambulatory Visit: Payer: Self-pay

## 2021-11-29 DIAGNOSIS — R531 Weakness: Secondary | ICD-10-CM

## 2021-11-29 NOTE — Discharge Instructions (Addendum)
-  I am concerned about your new weakness and trouble walking following epidural anesthesia.  I suspect that you need an MRI of the back to rule out a complication or spinal cord injury.  Please head straight there, have a friend or family member drive you.

## 2021-11-29 NOTE — ED Provider Notes (Signed)
[facility]    CSN: [id number] Arrival date & time: 11/29/21  1302      History   Chief Complaint Chief Complaint  Patient presents with   Leg Pain    HPI Tammy [name] is a 31 y.o. female presenting with L leg trouble moving x 2 weeks, getting progressively worse. Medical history c-section 09/23/2021 (two months ago).  Patient states sudden onset of difficulty moving her left leg for the last 2 weeks, without sensation changes or pain.  States she is concerned that she is having a complication from her epidural anesthesia, though the symptoms began over 4 weeks following her C-section.  Denies trauma or overuse prior to the symptoms, denies recent falls.  Has not attempted interventions at home.Marland Kitchen  HPI  Past Medical History:  Diagnosis Date   Chlamydia    Environmental allergies    Medical history non-contributory     Patient Active Problem List   Diagnosis Date Noted   Nexplanon insertion 10/30/2021   Previous cesarean section 09/24/2021   Obesity 07/03/2021   Marijuana use 03/18/2021    Past Surgical History:  Procedure Laterality Date   CESAREAN SECTION  09/24/2021   Procedure: CESAREAN SECTION;  Surgeon: Caren Macadam, MD;  Location: [facility] ORS;  Service: Obstetrics;;   wisdom teeth abstraction      OB History     Gravida  1   Para  1   Term  1   Preterm      AB      Living  1      SAB      IAB      Ectopic      Multiple  0   Live Births  1            Home Medications    Prior to Admission medications   Medication Sig Start Date End Date Taking? Authorizing Provider  acetaminophen (TYLENOL) 325 MG tablet Take 2 tablets (650 mg total) by mouth every 4 (four) hours as needed for mild pain (temperature > 101.5.). 09/26/21   Wells Guiles, DO  albuterol (VENTOLIN HFA) 108 (90 Base) MCG/ACT inhaler Inhale 1-2 puffs into the lungs every 6 (six) hours as needed for wheezing or shortness of breath. 07/03/21   Patriciaann Clan, DO  etonogestrel (NEXPLANON) 68 MG IMPL implant 1 each by Subdermal route once.    [provider]  ibuprofen (ADVIL) 200 MG tablet Take 400 mg by mouth as needed.    [provider]  NIFEdipine (ADALAT CC) 30 MG 24 hr tablet Take 1 tablet (30 mg total) by mouth daily. 09/25/21   Cresenzo-Dishmon, Joaquim Lai, CNM    Family History Family History  Problem Relation Age of Onset   Cancer Maternal Grandmother    Hypertension Mother    Hyperthyroidism Mother    Diabetes Maternal Aunt    Cancer Maternal Uncle     Social History Social History   Tobacco Use   Smoking status: Former    Types: Cigarettes   Smokeless tobacco: Never  Vaping Use   Vaping Use: Never used  Substance Use Topics   Alcohol use: Not Currently   Drug use: Not Currently    Types: Marijuana    Comment: possible marijuana use     Allergies   Carrot [daucus carota], Penicillins, Other, and Peach flavor   Review of Systems Review of Systems  Musculoskeletal:        Difficultly moving L leg  All other systems reviewed and are negative.   Physical Exam Triage Vital Signs ED Triage Vitals  Enc Vitals Group     BP 11/29/21 1316 111/75     Pulse Rate 11/29/21 1316 83     Resp 11/29/21 1316 20     Temp 11/29/21 1316 97.6 F (36.4 C)     Temp Source 11/29/21 1316 Oral     SpO2 11/29/21 1316 98 %     Weight --      Height --      Head Circumference --      Peak Flow --      Pain Score 11/29/21 1318 10     Pain Loc --      Pain Edu? --      Excl. in [place]? --    No data found.  Updated Vital Signs BP 111/75 (BP Location: Right Arm)    Pulse 83    Temp 97.6 F (36.4 C) (Oral)    Resp 20    SpO2 98%    Breastfeeding No   Visual Acuity Right Eye Distance:   Left Eye Distance:   Bilateral Distance:    Right Eye Near:   Left Eye Near:    Bilateral Near:     Physical Exam Vitals reviewed.  Constitutional:      General: She is not in acute distress.    Appearance:  Normal appearance. She is not ill-appearing or diaphoretic.  HENT:     Head: Normocephalic and atraumatic.  Cardiovascular:     Rate and Rhythm: Normal rate and regular rhythm.     Heart sounds: Normal heart sounds.  Pulmonary:     Effort: Pulmonary effort is normal.     Breath sounds: Normal breath sounds.  Musculoskeletal:     Comments: NO reproducible tenderness of back, legs, hips. No midline spinous tenderness, deformity, stepoff. L LE: Strength 5/5 thigh with flexion, 4/5 knee with flexion. Patient walks with exaggerated limp favoring this leg.   Skin:    General: Skin is warm.  Neurological:     General: No focal deficit present.     Mental Status: She is alert and oriented to person, place, and time.     Comments: CN 2-12 grossly intact, PERRLA, EOMI. Negative rhomberg, fingers to thumb.  Psychiatric:        Mood and Affect: Mood normal.        Behavior: Behavior normal.        Thought Content: Thought content normal.        Judgment: Judgment normal.     UC Treatments / Results  Labs (all labs ordered are listed, but only abnormal results are displayed) Labs Reviewed - No data to display  EKG   Radiology No results found.  Procedures Procedures (including critical care time)  Medications Ordered in UC Medications - No data to display  Initial Impression / Assessment and Plan / UC Course  I have reviewed the triage vital signs and the nursing notes.  Pertinent labs & imaging results that were available during my care of the patient were reviewed by me and considered in my medical decision making (see chart for details).     This patient is a very pleasant 31 y.o. year old female presenting with unilateral weakness- L LE. No trauma or overuse.  Patient expresses concern that she is having paralysis or other spinal cord injury from the epidural that she received on 09/23/2021 for her C-section.  Symptoms began  only about 2 weeks ago.  Overall reassuring exam  today, patient was initially in wheelchair, but began walking of her own volition during the visit and even denied assistance exiting the clinic.  There is possibly slight weakness in the left lower extremity/ knee, without sensation changes or saddle anesthesia.  No urinary symptoms or retention.  Given patient concern for spinal cord injury, discussed that this would need to be evaluated using MRI in the emergency department setting, she states that she will head there and have a family member drive her.   Final Clinical Impressions(s) / UC Diagnoses   Final diagnoses:  Unilateral weakness  Other complication of epidural anesthesia during labor and delivery     Discharge Instructions      -I am concerned about your new weakness and trouble walking following epidural anesthesia.  I suspect that you need an MRI of the back to rule out a complication or spinal cord injury.  Please head straight there, have a friend or family member drive you.     ED Prescriptions   None    PDMP not reviewed this encounter.   Hazel Sams, PA-C 11/29/21 1339

## 2021-11-29 NOTE — ED Triage Notes (Signed)
Pt reports pain in left leg x 2 weeks. States she woke up 1 day and was not able to move the left leg.

## 2021-11-30 ENCOUNTER — Emergency Department (HOSPITAL_COMMUNITY)
Admission: EM | Admit: 2021-11-30 | Discharge: 2021-11-30 | Disposition: A | Discharge status: Home / Self Care | Payer: Medicaid Other | Attending: Emergency Medicine | Admitting: Emergency Medicine

## 2021-11-30 ENCOUNTER — Other Ambulatory Visit: Payer: Self-pay

## 2021-11-30 ENCOUNTER — Encounter (HOSPITAL_COMMUNITY): Payer: Self-pay

## 2021-11-30 ENCOUNTER — Emergency Department (HOSPITAL_COMMUNITY): Payer: Medicaid Other

## 2021-11-30 DIAGNOSIS — M79605 Pain in left leg: Secondary | ICD-10-CM

## 2021-11-30 MED ORDER — METHOCARBAMOL 500 MG PO TABS: 500.0000 mg | ORAL_TABLET | Freq: Three times a day (TID) | ORAL | 0 refills | Status: DC | PRN

## 2021-11-30 NOTE — ED Notes (Signed)
MRI is taking patient at this time

## 2021-11-30 NOTE — ED Triage Notes (Signed)
Pt presents to ED with complaints of sharp left thigh pain shooting down left leg x 2 weeks. Pt states she is unable to walk.

## 2021-11-30 NOTE — ED Provider Notes (Signed)
[facility] EMERGENCY DEPARTMENT Provider Note   CSN: [id number] Arrival date & time: 11/30/21  0701     History  Chief Complaint  Patient presents with   Leg Pain    Tammy [name] is a 31 y.o. female.   Leg Pain Associated symptoms: no back pain   Patient presents with left leg pain.  Has had for around 2 weeks.  Around 6 weeks ago had a child by C-section.  Had epidural.  Was doing fine after.  Now pain in left leg.  Comes and goes.  Worse after she sits for a while.  Goes down the lateral side of left leg.  No swelling.  States in between will be fine but while it hurts she has trouble moving it.  No swelling.  Seen at urgent care yesterday and sent in to evaluate pathology from her epidural with MRI.    Home Medications Prior to Admission medications   Medication Sig Start Date End Date Taking? Authorizing Provider  acetaminophen (TYLENOL) 325 MG tablet Take 2 tablets (650 mg total) by mouth every 4 (four) hours as needed for mild pain (temperature > 101.5.). 09/26/21  Yes Shelby Mattocks, DO  albuterol (VENTOLIN HFA) 108 (90 Base) MCG/ACT inhaler Inhale 1-2 puffs into the lungs every 6 (six) hours as needed for wheezing or shortness of breath. 07/03/21  Yes Allayne Stack, DO  methocarbamol (ROBAXIN) 500 MG tablet Take 1 tablet (500 mg total) by mouth every 8 (eight) hours as needed for muscle spasms. 11/30/21  Yes Benjiman Core, MD  NIFEdipine (ADALAT CC) 30 MG 24 hr tablet Take 1 tablet (30 mg total) by mouth daily. 09/25/21  Yes Cresenzo-Dishmon, Scarlette [place]ico, CNM      Allergies    Carrot [daucus carota], Penicillins, Other, and Peach flavor    Review of Systems   Review of Systems  Constitutional:  Negative for appetite change.  Musculoskeletal:  Negative for back pain.       Left leg pain.  Skin:  Negative for rash.  Neurological:  Negative for weakness and numbness.   Physical Exam Updated Vital Signs BP 131/88 (BP Location: Right Arm)    Pulse 69    Temp 98.3  F (36.8 C) (Oral)    Resp 18    Ht 5\' 5"  (1.651 m)    Wt 108.9 kg    SpO2 100%    BMI 39.94 kg/m  Physical Exam Vitals and nursing note reviewed.  HENT:     Head: Normocephalic.  Cardiovascular:     Rate and Rhythm: Normal rate.  Musculoskeletal:     Cervical back: Neck supple.     Comments: No lumbar tenderness.  No erythema.  Some pain with straight leg raise on the left.  No swelling of the leg.  No edema.  Leg is warm.  Skin:    General: Skin is warm.     Capillary Refill: Capillary refill takes less than 2 seconds.  Neurological:     Mental Status: She is alert and oriented to person, place, and time.    ED Results / Procedures / Treatments   Labs (all labs ordered are listed, but only abnormal results are displayed) Labs Reviewed - No data to display  EKG None  Radiology MR LUMBAR SPINE WO CONTRAST  Result Date: 11/30/2021 CLINICAL DATA:  Leg weakness. Low back pain and inability to walk for 1 day. EXAM: MRI LUMBAR SPINE WITHOUT CONTRAST TECHNIQUE: Multiplanar, multisequence MR imaging of the lumbar spine was  performed. No intravenous contrast was administered. COMPARISON:  Abdominal radiographs 09/16/2017 FINDINGS: Segmentation:  Standard. Alignment:  Normal. Vertebrae: No fracture, suspicious marrow lesion, or significant marrow edema. Conus medullaris and cauda equina: Conus extends to the L1-2 level. Conus and cauda equina appear normal. Paraspinal and other soft tissues: Unremarkable. Disc levels: Preserved disc height and hydration throughout the lumbar spine. No disc herniation. Mild facet arthrosis throughout the lumbar spine. No stenosis. IMPRESSION: Mild lumbar facet arthrosis. No disc herniation or stenosis. Electronically Signed   By: Sebastian Ache M.D.   On: 11/30/2021 10:10    Procedures Procedures    Medications Ordered in ED Medications - No data to display  ED Course/ Medical Decision Making/ A&P                           Medical Decision Making Amount  and/or Complexity of Data Reviewed External Data Reviewed: notes. Radiology: ordered and independent interpretation performed. Decision-making details documented in ED Course.  Patient presents with left lower extremity pain.  Worse with movements.  Somewhat spasm.  Pain with walking at times or is worse with sitting down.  Worse with movemet.  Improved with moving around.  Seen at urgent care and sent in for MRI.  Recently had a pregnancy and had epidural for it.  Did have C-section but appears stated epidural and not switched to spinal.  MRI done and reassuring.  Discharge home with symptomatic treatment.  Patient has not breast-feeding.         Final Clinical Impression(s) / ED Diagnoses Final diagnoses:  Left leg pain    Rx / DC Orders ED Discharge Orders          Ordered    methocarbamol (ROBAXIN) 500 MG tablet  Every 8 hours PRN        11/30/21 1034              Benjiman Core, MD 12/01/21 1454

## 2021-11-30 NOTE — ED Notes (Signed)
Pt called out, crying in the bathroom on the toilet stating " I can't walk my leg is in too much pain"

## 2022-01-19 ENCOUNTER — Telehealth: Payer: Self-pay

## 2022-01-19 NOTE — Telephone Encounter (Signed)
Returned patient's call.  Unable to leave voicemail. 

## 2022-01-19 NOTE — Telephone Encounter (Signed)
Pt states that she needs a refill on her blood pressure meds asap she only has 2 pills left.

## 2022-01-20 ENCOUNTER — Other Ambulatory Visit: Payer: Self-pay | Admitting: Student

## 2022-01-20 NOTE — Telephone Encounter (Signed)
Attempted to reach patient. VM not set up.

## 2022-02-09 ENCOUNTER — Encounter: Payer: Self-pay | Admitting: Women's Health

## 2022-02-09 ENCOUNTER — Other Ambulatory Visit: Payer: Medicaid Other

## 2022-02-09 ENCOUNTER — Telehealth: Payer: Self-pay | Admitting: *Deleted

## 2022-02-09 ENCOUNTER — Other Ambulatory Visit (HOSPITAL_COMMUNITY)
Admission: RE | Admit: 2022-02-09 | Discharge: 2022-02-09 | Disposition: A | Discharge status: Home / Self Care | Payer: Medicaid Other | Source: Ambulatory Visit | Attending: Obstetrics & Gynecology | Admitting: Obstetrics & Gynecology

## 2022-02-09 ENCOUNTER — Other Ambulatory Visit: Payer: Self-pay

## 2022-02-09 ENCOUNTER — Other Ambulatory Visit (INDEPENDENT_AMBULATORY_CARE_PROVIDER_SITE_OTHER): Payer: Medicaid Other | Admitting: *Deleted

## 2022-02-09 VITALS — BP 115/79 | HR 83

## 2022-02-09 DIAGNOSIS — N898 Other specified noninflammatory disorders of vagina: Secondary | ICD-10-CM

## 2022-02-09 DIAGNOSIS — Z8679 Personal history of other diseases of the circulatory system: Secondary | ICD-10-CM | POA: Insufficient documentation

## 2022-02-09 NOTE — Progress Notes (Signed)
? ?  NURSE VISIT- VAGINITIS/STD/POC ? ?SUBJECTIVE:  ?Tammy [name] is a 31 y.o. G1P1001 GYN patientfemale here for a vaginal swab for vaginitis screening, STD screen.  She reports the following symptoms:  vaginal itching and discharge  for 2 weeks. ?Denies abnormal vaginal bleeding, significant pelvic pain, fever, or UTI symptoms. ? ?OBJECTIVE:  ?BP 115/79 (BP Location: Left Arm, Patient Position: Sitting, Cuff Size: Large)   Pulse 83   ?Appears well, in no apparent distress ? ?ASSESSMENT: ?Vaginal swab for vaginitis screening & STD screen.  ? ?PLAN: ?Self-collected vaginal probe for Gonorrhea, Chlamydia, Trichomonas, Bacterial Vaginosis, Yeast sent to lab ?Treatment: to be determined once results are received ?Follow-up as needed if symptoms persist/worsen, or new symptoms develop. Pt delivered 09/24/21. She has been out of BP med X 2 weeks. Does she need to continue BP med? Also, pt is requesting a refill on Robaxin.  ? ?Malachy Mood  ?02/09/2022 ?12:05 PM ? ?

## 2022-02-09 NOTE — Telephone Encounter (Signed)
Left message @ 1:25 pm letting pt know she don't need to continue Procardia and Robaxin was given in the ED. She will need to reach out to her PCP for that refill. JSY ?

## 2022-02-10 ENCOUNTER — Other Ambulatory Visit: Payer: Self-pay | Admitting: Adult Health

## 2022-02-10 LAB — CERVICOVAGINAL ANCILLARY ONLY
Bacterial Vaginitis (gardnerella): NEGATIVE
Candida Glabrata: NEGATIVE
Candida Vaginitis: POSITIVE — AB
Chlamydia: NEGATIVE
Comment: NEGATIVE
Comment: NEGATIVE
Comment: NEGATIVE
Comment: NEGATIVE
Comment: NEGATIVE
Comment: NORMAL
Neisseria Gonorrhea: NEGATIVE
Trichomonas: NEGATIVE

## 2022-02-11 ENCOUNTER — Other Ambulatory Visit: Payer: Self-pay | Admitting: Adult Health

## 2022-02-11 MED ORDER — FLUCONAZOLE 150 MG PO TABS: ORAL_TABLET | ORAL | 1 refills | Status: DC

## 2022-02-11 NOTE — Progress Notes (Signed)
+  yeast on CV swab rx sent in for diflucan ?

## 2022-10-21 ENCOUNTER — Encounter: Payer: Self-pay | Admitting: Adult Health

## 2022-10-21 ENCOUNTER — Ambulatory Visit (INDEPENDENT_AMBULATORY_CARE_PROVIDER_SITE_OTHER): Payer: Medicaid Other | Admitting: Adult Health

## 2022-10-21 ENCOUNTER — Other Ambulatory Visit (HOSPITAL_COMMUNITY)
Admission: RE | Admit: 2022-10-21 | Discharge: 2022-10-21 | Disposition: A | Discharge status: Home / Self Care | Payer: Medicaid Other | Source: Ambulatory Visit | Attending: Adult Health | Admitting: Adult Health

## 2022-10-21 VITALS — BP 133/84 | HR 95 | Ht 65.0 in | Wt 245.0 lb

## 2022-10-21 DIAGNOSIS — N898 Other specified noninflammatory disorders of vagina: Secondary | ICD-10-CM | POA: Diagnosis not present

## 2022-10-21 DIAGNOSIS — R319 Hematuria, unspecified: Secondary | ICD-10-CM | POA: Diagnosis not present

## 2022-10-21 DIAGNOSIS — R809 Proteinuria, unspecified: Secondary | ICD-10-CM | POA: Diagnosis not present

## 2022-10-21 DIAGNOSIS — Z113 Encounter for screening for infections with a predominantly sexual mode of transmission: Secondary | ICD-10-CM | POA: Insufficient documentation

## 2022-10-21 DIAGNOSIS — Z3202 Encounter for pregnancy test, result negative: Secondary | ICD-10-CM

## 2022-10-21 LAB — POCT URINALYSIS DIPSTICK
Glucose, UA: NEGATIVE
Ketones, UA: NEGATIVE
Leukocytes, UA: NEGATIVE
Nitrite, UA: NEGATIVE
Protein, UA: POSITIVE — AB

## 2022-10-21 LAB — POCT URINE PREGNANCY: Preg Test, Ur: NEGATIVE

## 2022-10-21 NOTE — Progress Notes (Signed)
  Subjective:     Patient ID: Tammy [name], female   DOB: [birth year], 31 y.o.   MRN: [id number]  HPI Tammy [name] is a 31 year old black female,with DP, G1P1001, in complaining of vaginal itching and irritation.  Last pap was negative HPV and malignancy 12/14/20  PCP is [facility]  Review of Systems +vaginal itching and irritation No new sex partners Reviewed past medical,surgical, social and family history. Reviewed medications and allergies.     Objective:   Physical Exam BP 133/84 (BP Location: Right Arm, Patient Position: Sitting, Cuff Size: Normal)   Pulse 95   Ht 5\' 5"  (1.651 m)   Wt 245 lb (111.1 kg)   LMP 10/05/2022 (Approximate)   Breastfeeding No   BMI 40.77 kg/m   UPT is negative, urine dipstick trace blood and protein   Skin warm and dry.Pelvic: external genitalia is normal in appearance no lesions, vagina: white discharge without odor,urethra has no lesions or masses noted, cervix:smooth and bulbous, uterus: normal size, shape and contour, non tender, no masses felt, adnexa: no masses or tenderness noted. Bladder is non tender and no masses felt. CV swab obtained. Fall risk is low  Upstream - 10/21/22 1118       Pregnancy Intention Screening   Does the patient want to become pregnant in the next year? No    Does the patient's partner want to become pregnant in the next year? No    Would the patient like to discuss contraceptive options today? No      Contraception Wrap Up   Current Method Hormonal Implant    End Method Hormonal Implant            Examination chaperoned by 10/23/22 LPN  Assessment:     1. Pregnancy examination or test, negative result - POCT urine pregnancy  2. Hematuria, unspecified type - POCT Urinalysis Dipstick  3. Proteinuria, unspecified type - POCT Urinalysis Dipstick  4. Vaginal discharge CV swab sent  for GC/CHL,trich, BV and yeast  - Cervicovaginal ancillary only( [place])  5. Vaginal itching CV swab sent  -  Cervicovaginal ancillary only( [place])  6. Screening examination for STD (sexually transmitted disease) CV swab sent GC/CHL and trich, with BV and yeast  - Cervicovaginal ancillary only( [place])     Plan:     Will talk when CV swab back regarding any treatment Follow up prn

## 2022-10-22 LAB — CERVICOVAGINAL ANCILLARY ONLY
Bacterial Vaginitis (gardnerella): POSITIVE — AB
Candida Glabrata: NEGATIVE
Candida Vaginitis: NEGATIVE
Chlamydia: NEGATIVE
Comment: NEGATIVE
Comment: NEGATIVE
Comment: NEGATIVE
Comment: NEGATIVE
Comment: NEGATIVE
Comment: NORMAL
Neisseria Gonorrhea: NEGATIVE
Trichomonas: NEGATIVE

## 2022-10-25 ENCOUNTER — Telehealth: Payer: Self-pay | Admitting: Adult Health

## 2022-10-25 ENCOUNTER — Other Ambulatory Visit: Payer: Self-pay | Admitting: Adult Health

## 2022-10-25 MED ORDER — METRONIDAZOLE 500 MG PO TABS: 500.0000 mg | ORAL_TABLET | Freq: Two times a day (BID) | ORAL | 0 refills | Status: DC

## 2022-10-25 NOTE — Progress Notes (Signed)
+  BV on vaginal swab will rx flagyl,no sex or alcohol while taking  ?

## 2022-10-25 NOTE — Telephone Encounter (Signed)
Patient sent mychart message about needing medicine due to her abnormal results. Please advise.

## 2023-06-20 IMAGING — MR MR LUMBAR SPINE W/O CM
5 series · 33 of 48 positions shown · non-contrast
Comparison: Abdominal radiographs 09/16/2017

CLINICAL DATA: Leg weakness. Low back pain and inability to walk
for 1 day.

EXAM:
MRI LUMBAR SPINE WITHOUT CONTRAST
TECHNIQUE: Multiplanar, multisequence MR imaging of the lumbar spine was
performed. No intravenous contrast was administered.

[Series 5: T2 · sagittal · 4.0mm · 0.81mm/px · 6 of 16 slices shown (1 of 2)]
[im 1/16]
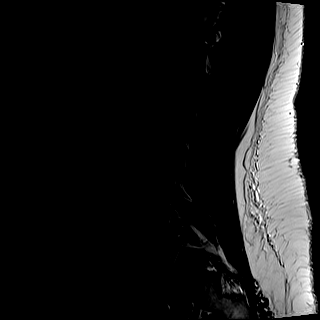
[im 4/16]
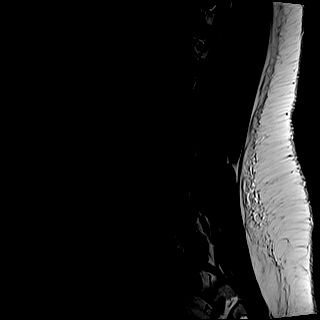
[im 7/16]
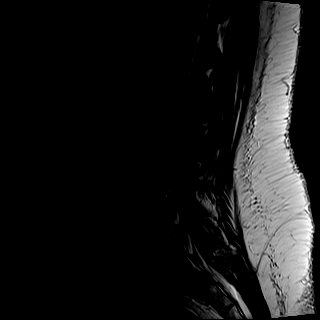
[im 10/16]
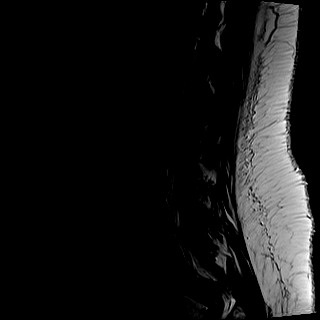
[im 13/16]
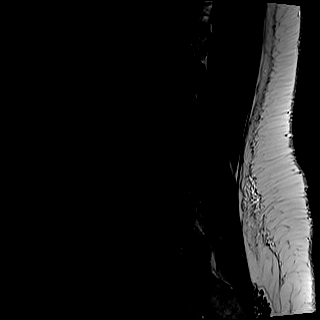
[im 16/16]
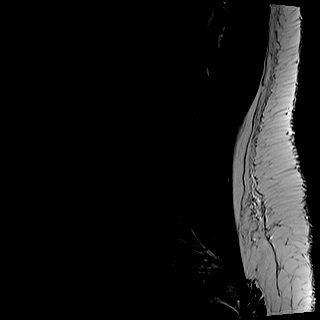

[Series 6: STIR · sagittal · 4.0mm · 0.51mm/px · 4 of 16 slices shown]
[im 1/16]
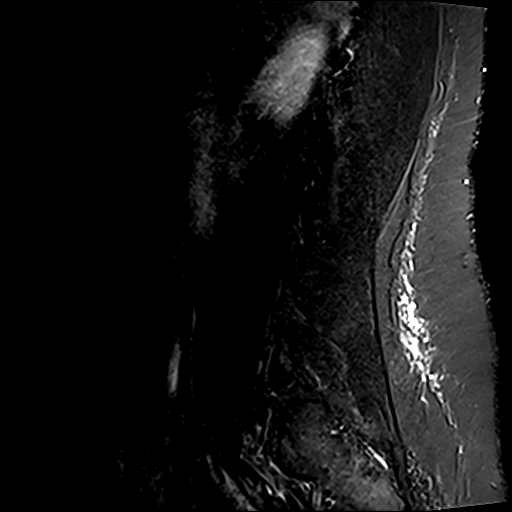
[im 3/16]
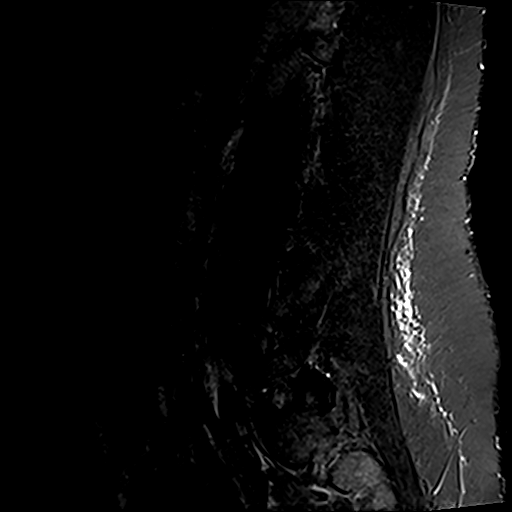
[im 6/16]
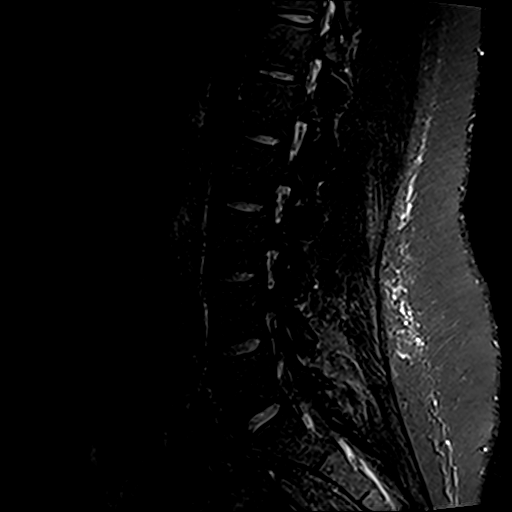
[im 8/16]
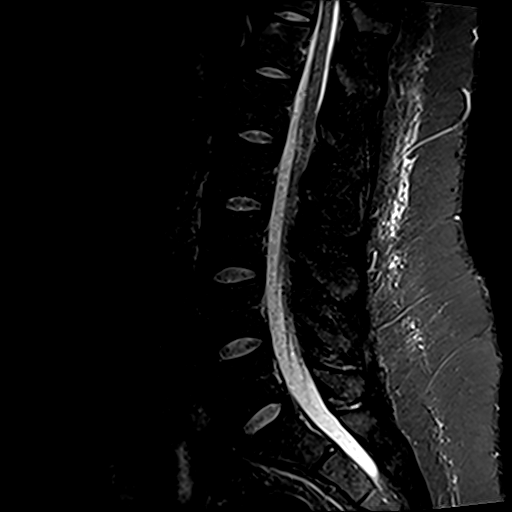

[Series 7: T1 · sagittal · 4.0mm · 1.02mm/px · 7 of 16 slices shown (1 of 2)]
[im 1/16]
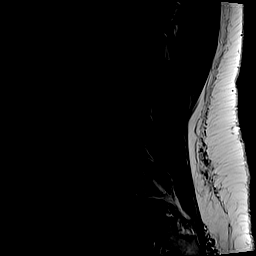
[im 3/16]
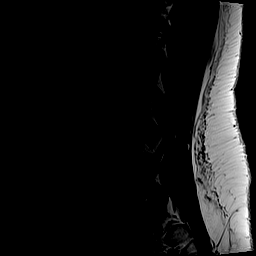
[im 6/16]
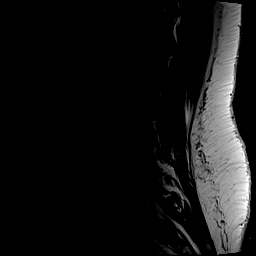
[im 8/16]
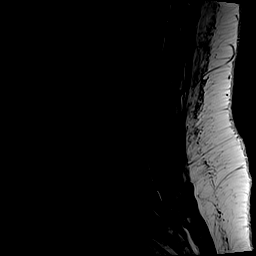
[im 11/16]
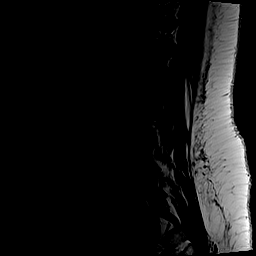
[im 13/16]
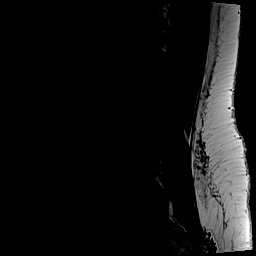
[im 16/16]
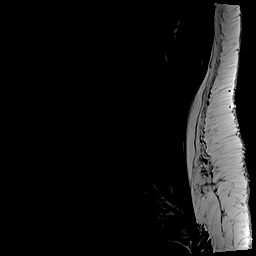

[Series 8: T2 · axial · 4.0mm · 0.70mm/px · z∈[-122,+69]mm · 8 of 32 slices shown (2 of 2)]
[im 1/32]
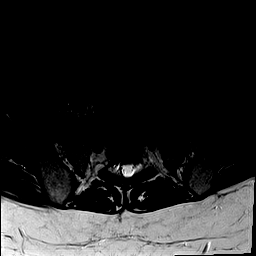
[im 5/32]
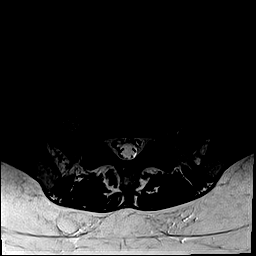
[im 10/32]
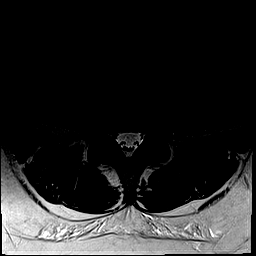
[im 15/32]
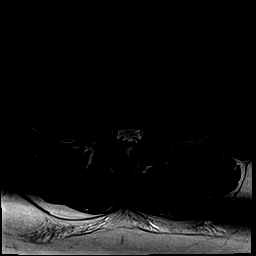
[im 17/32]
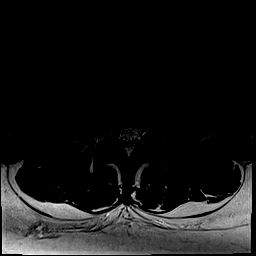
[im 22/32]
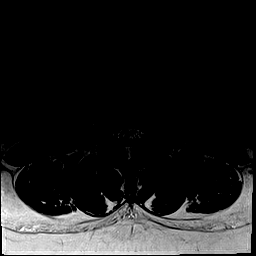
[im 27/32]
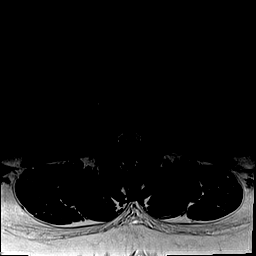
[im 32/32]
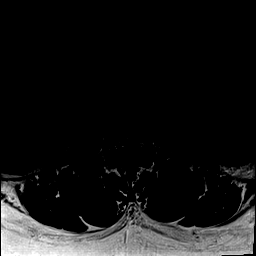

[Series 9: T1 · axial · 4.0mm · 0.35mm/px · z∈[-122,+69]mm · 8 of 32 slices shown (2 of 2)]
[im 1/32]
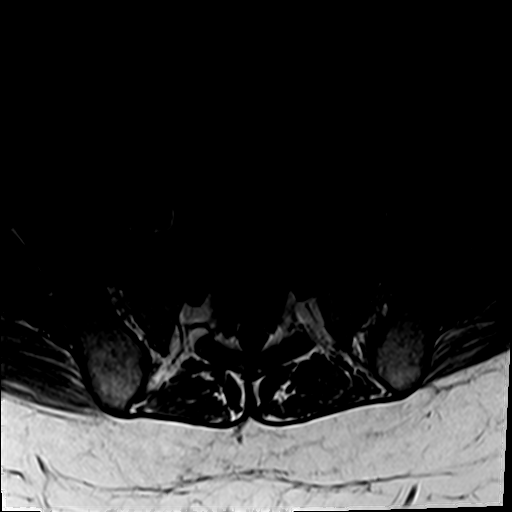
[im 5/32]
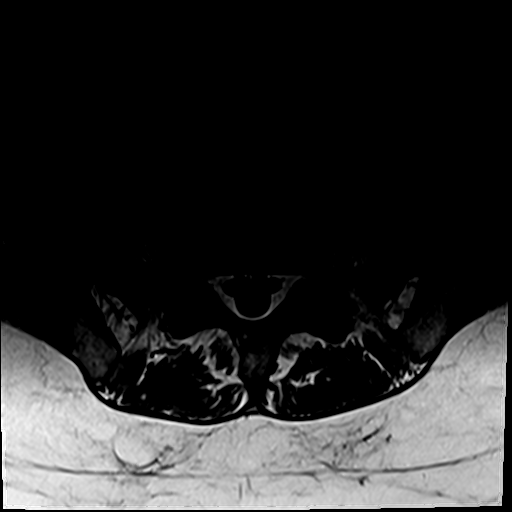
[im 10/32]
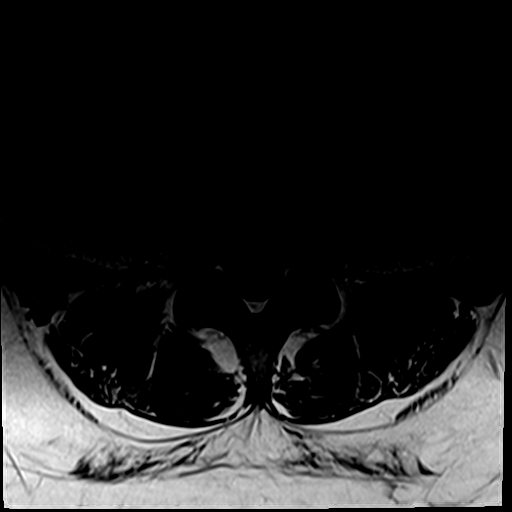
[im 15/32]
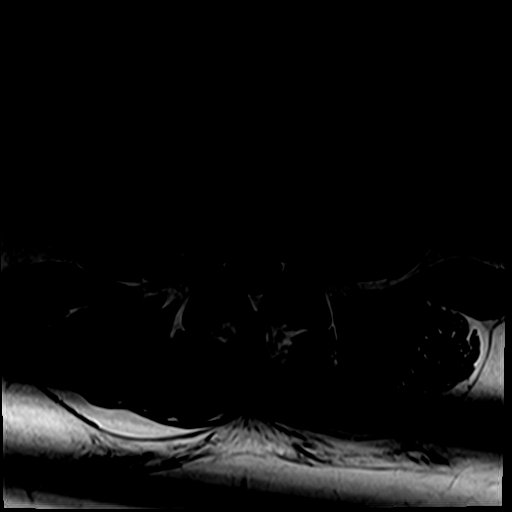
[im 17/32]
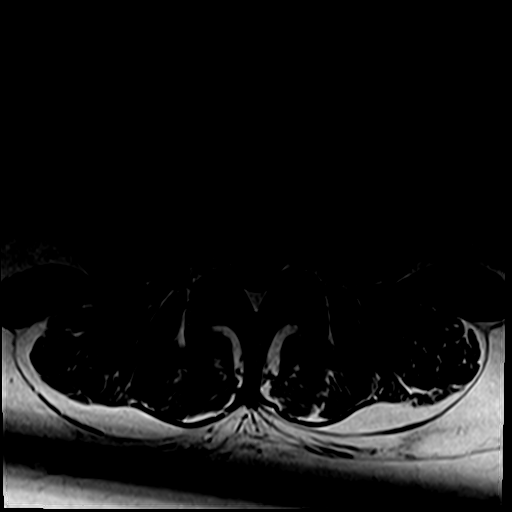
[im 22/32]
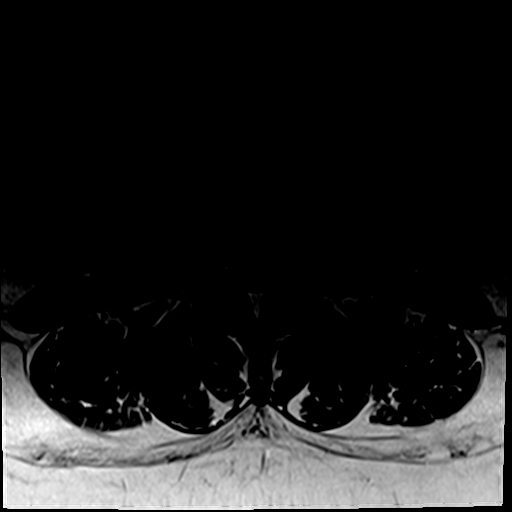
[im 27/32]
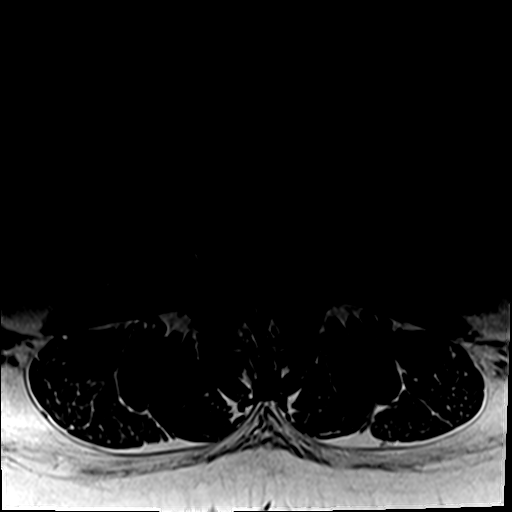
[im 32/32]
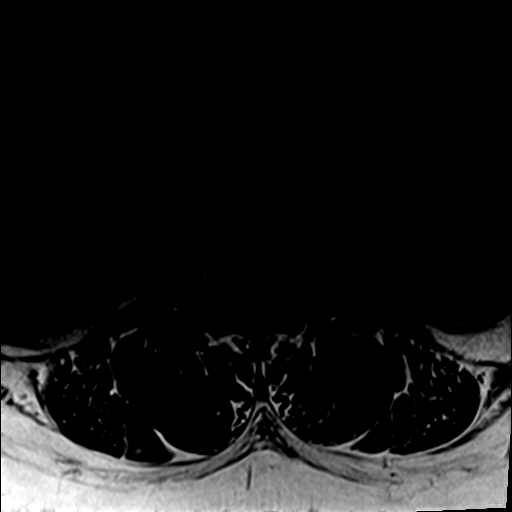

[33 of 48 positions shown; findings below may reference images not displayed]

FINDINGS: Segmentation:  Standard.

Alignment:  Normal.

Vertebrae: No fracture, suspicious marrow lesion, or significant
marrow edema.

Conus medullaris and cauda equina: Conus extends to the L1-2 level.
Conus and cauda equina appear normal.

Paraspinal and other soft tissues: Unremarkable.

Disc levels:

Preserved disc height and hydration throughout the lumbar spine. No
disc herniation. Mild facet arthrosis throughout the lumbar spine.
No stenosis.
IMPRESSION: Mild lumbar facet arthrosis. No disc herniation or stenosis.

## 2024-09-10 ENCOUNTER — Emergency Department (HOSPITAL_COMMUNITY)
Admission: EM | Admit: 2024-09-10 | Discharge: 2024-09-10 | Disposition: A | Discharge status: Home / Self Care | Attending: Emergency Medicine | Admitting: Emergency Medicine

## 2024-09-10 DIAGNOSIS — N760 Acute vaginitis: Secondary | ICD-10-CM | POA: Diagnosis not present

## 2024-09-10 DIAGNOSIS — N898 Other specified noninflammatory disorders of vagina: Secondary | ICD-10-CM | POA: Diagnosis present

## 2024-09-10 DIAGNOSIS — B9689 Other specified bacterial agents as the cause of diseases classified elsewhere: Secondary | ICD-10-CM | POA: Insufficient documentation

## 2024-09-10 LAB — COMPREHENSIVE METABOLIC PANEL WITH GFR
ALT: 7 U/L (ref 0–44)
AST: 11 U/L — ABNORMAL LOW (ref 15–41)
Albumin: 4.5 g/dL (ref 3.5–5.0)
Alkaline Phosphatase: 55 U/L (ref 38–126)
Anion gap: 8 (ref 5–15)
BUN: 10 mg/dL (ref 6–20)
CO2: 26 mmol/L (ref 22–32)
Calcium: 9.3 mg/dL (ref 8.9–10.3)
Chloride: 104 mmol/L (ref 98–111)
Creatinine, Ser: 0.9 mg/dL (ref 0.44–1.00)
GFR, Estimated: >60 mL/min (ref >60)
Glucose, Bld: 100 mg/dL — ABNORMAL HIGH (ref 70–99)
Potassium: 4 mmol/L (ref 3.5–5.1)
Sodium: 139 mmol/L (ref 135–145)
Total Bilirubin: 0.3 mg/dL (ref 0.0–1.2)
Total Protein: 7.2 g/dL (ref 6.5–8.1)

## 2024-09-10 LAB — WET PREP, GENITAL
Sperm: NONE SEEN
Trich, Wet Prep: NONE SEEN
WBC, Wet Prep HPF POC: <10 (ref <10)
Yeast Wet Prep HPF POC: NONE SEEN

## 2024-09-10 LAB — CBC
HCT: 34.7 % — ABNORMAL LOW (ref 36.0–46.0)
Hemoglobin: 11.7 g/dL — ABNORMAL LOW (ref 12.0–15.0)
MCH: 32.1 pg (ref 26.0–34.0)
MCHC: 33.7 g/dL (ref 30.0–36.0)
MCV: 95.3 fL (ref 80.0–100.0)
Platelets: 375 K/uL (ref 150–400)
RBC: 3.64 MIL/uL — ABNORMAL LOW (ref 3.87–5.11)
RDW: 13.6 % (ref 11.5–15.5)
WBC: 13 K/uL — ABNORMAL HIGH (ref 4.0–10.5)
nRBC: 0 % (ref 0.0–0.2)

## 2024-09-10 LAB — RAPID HIV SCREEN (HIV 1/2 AB+AG)
HIV 1/2 Antibodies: NONREACTIVE
HIV-1 P24 Antigen - HIV24: NONREACTIVE

## 2024-09-10 LAB — URINALYSIS, ROUTINE W REFLEX MICROSCOPIC
Bacteria, UA: NONE SEEN
Bilirubin Urine: NEGATIVE
Glucose, UA: NEGATIVE mg/dL
Ketones, ur: NEGATIVE mg/dL
Leukocytes,Ua: NEGATIVE
Nitrite: NEGATIVE
Protein, ur: NEGATIVE mg/dL
Specific Gravity, Urine: 1.026 (ref 1.005–1.030)
pH: 5 (ref 5.0–8.0)

## 2024-09-10 LAB — LIPASE, BLOOD: Lipase: 23 U/L (ref 11–51)

## 2024-09-10 LAB — POC URINE PREG, ED: Preg Test, Ur: NEGATIVE

## 2024-09-10 MED ORDER — METRONIDAZOLE 500 MG PO TABS
500.0000 mg | ORAL_TABLET | Freq: Once | ORAL | Status: AC
  Administered 2024-09-10: 500 mg via ORAL
  Filled 2024-09-10: qty 1

## 2024-09-10 MED ORDER — METRONIDAZOLE 500 MG PO TABS: 500.0000 mg | ORAL_TABLET | Freq: Two times a day (BID) | ORAL | 0 refills | Status: DC

## 2024-09-10 NOTE — Discharge Instructions (Addendum)
 Take the entire course of the antibiotics prescribed for your bacterial vaginosis.  As discussed your gonorrhea and Chlamydia test are pending but should be resulted by tomorrow or Wednesday at the latest.  Keep a watch on your MyChart for these results.  I strongly encourage you to avoid intercourse until these have resulted and are negative.  If positive you and your partner will need to be treated with additional medications.

## 2024-09-10 NOTE — ED Triage Notes (Signed)
 Pt c/o lower abd pain with cramping for two days. Pt denies N/V/D, dysuria, abnormal vaginal discharge. LMP approximately 30 days ago per pt.

## 2024-09-11 LAB — RPR: RPR Ser Ql: NONREACTIVE

## 2024-09-12 LAB — GC/CHLAMYDIA PROBE AMP (~~LOC~~) NOT AT ARMC
Chlamydia: NEGATIVE
Comment: NEGATIVE
Comment: NORMAL
Neisseria Gonorrhea: NEGATIVE

## 2024-09-12 NOTE — ED Provider Notes (Signed)
 [place] EMERGENCY DEPARTMENT AT [facility] Provider Note   CSN: [id number] Arrival date & time: 09/10/24  2047     Patient presents with: Abdominal Pain   Tammy [name] is a 33 y.o. female with no significant past medical history presenting for evaluation of vaginal irritation with a white vaginal discharge.  She also endorses mild pelvic cramping for the past 2 days reminding her of menstrual cramping although she has not menstruated since being on Nexplanon .  She she denies fevers or chills, nausea or vomiting, dysuria.  She does state she is concern for possibility of STD as she was with a new partner prior to onset of symptoms but also states she received oral sex only, not full intercourse.  She would like to be screened for STDs.   The history is provided by the patient.       Prior to Admission medications   Medication Sig Start Date End Date Taking? Authorizing Provider  metroNIDAZOLE  (FLAGYL ) 500 MG tablet Take 1 tablet (500 mg total) by mouth 2 (two) times daily. 09/10/24  Yes Curtiss Mahmood, PA-C  acetaminophen  (TYLENOL ) 325 MG tablet Take 2 tablets (650 mg total) by mouth every 4 (four) hours as needed for mild pain (temperature > 101.5.). 09/26/21   Dahbura, Anton, DO  albuterol  (VENTOLIN  HFA) 108 (90 Base) MCG/ACT inhaler Inhale 1-2 puffs into the lungs every 6 (six) hours as needed for wheezing or shortness of breath. 07/03/21   Jarrett Lucie SAILOR, DO  etonogestrel  (NEXPLANON ) 68 MG IMPL implant 1 each by Subdermal route once.    [provider]    Allergies: Carrot [daucus carota], Penicillins, Other, and Peach flavoring agent (non-screening)    Review of Systems  Constitutional:  Negative for fever.  HENT:  Negative for congestion and sore throat.   Eyes: Negative.   Respiratory:  Negative for chest tightness and shortness of breath.   Cardiovascular:  Negative for chest pain.  Gastrointestinal:  Negative for abdominal pain, nausea and  vomiting.  Genitourinary:  Positive for flank pain and vaginal discharge.  Musculoskeletal:  Negative for arthralgias, joint swelling and neck pain.  Skin: Negative.  Negative for rash and wound.  Neurological:  Negative for dizziness, weakness, light-headedness, numbness and headaches.  Psychiatric/Behavioral: Negative.      Updated Vital Signs BP 128/72 (BP Location: Right Arm)   Pulse 85   Temp 98.7 F (37.1 C) (Oral)   Resp 20   SpO2 100%   Physical Exam Vitals and nursing note reviewed.  Constitutional:      Appearance: She is well-developed.  HENT:     Head: Normocephalic and atraumatic.  Eyes:     Conjunctiva/sclera: Conjunctivae normal.  Cardiovascular:     Rate and Rhythm: Normal rate and regular rhythm.     Heart sounds: Normal heart sounds.  Pulmonary:     Effort: Pulmonary effort is normal.     Breath sounds: Normal breath sounds. No wheezing.  Abdominal:     General: Bowel sounds are normal.     Palpations: Abdomen is soft.     Tenderness: There is no abdominal tenderness. There is no guarding or rebound.  Genitourinary:    Comments: Deferred pelvic exam, patient self collected specimens. Musculoskeletal:        General: Normal range of motion.     Cervical back: Normal range of motion.  Skin:    General: Skin is warm and dry.  Neurological:     Mental Status: She is  alert.     (all labs ordered are listed, but only abnormal results are displayed) Labs Reviewed  WET PREP, GENITAL - Abnormal; Notable for the following components:      Result Value   Clue Cells Wet Prep HPF POC PRESENT (*)    All other components within normal limits  COMPREHENSIVE METABOLIC PANEL WITH GFR - Abnormal; Notable for the following components:   Glucose, Bld 100 (*)    AST 11 (*)    All other components within normal limits  CBC - Abnormal; Notable for the following components:   WBC 13.0 (*)    RBC 3.64 (*)    Hemoglobin 11.7 (*)    HCT 34.7 (*)    All other  components within normal limits  URINALYSIS, ROUTINE W REFLEX MICROSCOPIC - Abnormal; Notable for the following components:   APPearance HAZY (*)    Hgb urine dipstick SMALL (*)    All other components within normal limits  POC URINE PREG, ED - Normal  LIPASE, BLOOD  RPR  RAPID HIV SCREEN (HIV 1/2 AB+AG)  GC/CHLAMYDIA PROBE AMP ([place]) NOT AT [facility]    EKG: None  Radiology: No results found.   Procedures   Medications Ordered in the ED  metroNIDAZOLE  (FLAGYL ) tablet 500 mg (500 mg Oral Given 09/10/24 2322)                                    Medical Decision Making Patient presenting with vaginal discharge and mild pelvic cramping but has a completely normal abdominal exam without tenderness, guarding or rebound.  She is concerned for possible STDs, and has been screened including a wet prep, gonorrhea chlamydia, RPR and HIV.  Given the nature of her encounter it is unlikely her GC chlamydia will result positive, however she was offered full prophylactic treatment.  She will wait for cultures.  She was placed on Flagyl  for her bacterial vaginosis.  She was given return precautions, also discussed need for partner to be treated if cultures are positive.  Amount and/or Complexity of Data Reviewed Labs: ordered.    Details: Labs reviewed, significant for clue cells on wet prep only, her rapid HIV is negative,   Risk Prescription drug management.        Final diagnoses:  Bacterial vaginosis    ED Discharge Orders          Ordered    metroNIDAZOLE  (FLAGYL ) 500 MG tablet  2 times daily        09/10/24 2305               Birdena Mliss RIGGERS 09/12/24 2353    Cleotilde Rogue, MD 10/01/24 1255

## 2024-09-13 ENCOUNTER — Encounter: Payer: Self-pay | Admitting: Adult Health

## 2024-09-13 ENCOUNTER — Other Ambulatory Visit (HOSPITAL_COMMUNITY)
Admission: RE | Admit: 2024-09-13 | Discharge: 2024-09-13 | Disposition: A | Discharge status: Home / Self Care | Source: Ambulatory Visit | Attending: Adult Health | Admitting: Adult Health

## 2024-09-13 ENCOUNTER — Ambulatory Visit (INDEPENDENT_AMBULATORY_CARE_PROVIDER_SITE_OTHER): Admitting: Adult Health

## 2024-09-13 VITALS — BP 108/68 | HR 80 | Ht 65.0 in | Wt 218.5 lb

## 2024-09-13 DIAGNOSIS — Z0000 Encounter for general adult medical examination without abnormal findings: Secondary | ICD-10-CM | POA: Insufficient documentation

## 2024-09-13 DIAGNOSIS — Z975 Presence of (intrauterine) contraceptive device: Secondary | ICD-10-CM

## 2024-09-13 DIAGNOSIS — R55 Syncope and collapse: Secondary | ICD-10-CM

## 2024-09-13 DIAGNOSIS — Z01419 Encounter for gynecological examination (general) (routine) without abnormal findings: Secondary | ICD-10-CM

## 2024-09-13 DIAGNOSIS — Z1151 Encounter for screening for human papillomavirus (HPV): Secondary | ICD-10-CM

## 2024-09-13 DIAGNOSIS — Z1331 Encounter for screening for depression: Secondary | ICD-10-CM | POA: Diagnosis not present

## 2024-09-13 NOTE — Progress Notes (Signed)
 Patient ID: Tammy [name], female   DOB: 1991/01/31, 31 y.o.   MRN: [id number] History of Present Illness: Tammy [name] is a 33 year old black female, with DP, G1P1001, in for a well woman gyn exam and pap.  PCP is [facility]   Current Medications, Allergies, Past Medical History, Past Surgical History, Family History and Social History were reviewed in [employer] record.     Review of Systems: Patient denies any headaches, hearing loss, fatigue, blurred vision, shortness of breath, chest pain, abdominal pain, problems with bowel movements, urination, or intercourse. No joint pain, occasional  mood swings.  Periods are random with nexplanon  She has had 4 fainting episodes recently, usually when have BM, will get hot first   Physical Exam:BP 108/68 (BP Location: Left Arm, Patient Position: Sitting, Cuff Size: Large)   Pulse 80   Ht 5' 5 (1.651 m)   Wt 218 lb 8 oz (99.1 kg)   LMP 08/10/2024 (Approximate)   BMI 36.36 kg/m   General:  Well developed, well nourished, no acute distress Skin:  Warm and dry Neck:  Midline trachea, normal thyroid, good ROM, no lymphadenopathy Lungs; Clear to auscultation bilaterally Breast:  No dominant palpable mass, retraction, or nipple discharge Cardiovascular: Regular rate and rhythm Abdomen:  Soft, non tender, no hepatosplenomegaly Pelvic:  External genitalia is normal in appearance, no lesions.  The vagina is normal in appearance. Urethra has no lesions or masses. The cervix is smooth, pap with HR HPV genotyping performed.  Uterus is felt to be normal size, shape, and contour.  No adnexal masses or tenderness noted.Bladder is non tender, no masses felt. Extremities/musculoskeletal:  No swelling or varicosities noted, no clubbing or cyanosis Psych:  No mood changes, alert and cooperative,seems happy AA is 3    09/13/2024    1:54 PM 06/19/2021    9:12 AM 03/16/2021   10:46 AM  Depression screen PHQ 2/9  Decreased Interest 3 0 1   Down, Depressed, Hopeless 0 0 0  PHQ - 2 Score 3 0 1  Altered sleeping 0 0 0  Tired, decreased energy 0 0 0  Change in appetite 0 0 0  Feeling bad or failure about yourself  0 0 0  Trouble concentrating 0 0 0  Moving slowly or fidgety/restless 0 0 1  Suicidal thoughts 0 0 0  PHQ-9 Score 3 0 2       09/13/2024    1:54 PM 06/19/2021    9:12 AM 03/16/2021   10:46 AM 02/04/2021    4:14 PM  GAD 7 : Generalized Anxiety Score  Nervous, Anxious, on Edge 0 0 0 0  Control/stop worrying 0 0 0 1  Worry too much - different things 0 0 0 1  Trouble relaxing 0 0 0 1  Restless 0 0 0 0  Easily annoyed or irritable 0 0 1 2  Afraid - awful might happen 0 0 0 2  Total GAD 7 Score 0 0 1 7    Upstream - 09/13/24 1351       Pregnancy Intention Screening   Does the patient want to become pregnant in the next year? No    Does the patient's partner want to become pregnant in the next year? No    Would the patient like to discuss contraceptive options today? No      Contraception Wrap Up   Current Method Hormonal Implant    End Method Hormonal Implant    Contraception Counseling Provided Yes  Examination chaperoned by Clarita Salt LPN   Impression and plan: 1. Routine general medical examination at a health care facility (Primary) Pap sent Pap in 3 years if negative Physical in 1 year Labs with PCP - Cytology - PAP( [place])  2. Nexplanon  in place Placed 09/25/21 Return  09/19/24 for removal and replacement   3. Syncope, unspecified syncope type Has fainted about 4 x recently when trying to have BM, will get hot first, could be vasovagal response, when straining, but will refer to cardiology to be evaluated - Ambulatory referral to Cardiology

## 2024-09-17 ENCOUNTER — Ambulatory Visit: Payer: Self-pay | Admitting: Adult Health

## 2024-09-17 LAB — CYTOLOGY - PAP
Comment: NEGATIVE
Diagnosis: NEGATIVE
High risk HPV: NEGATIVE

## 2024-09-19 ENCOUNTER — Encounter: Payer: Self-pay | Admitting: Adult Health

## 2024-09-19 ENCOUNTER — Ambulatory Visit (INDEPENDENT_AMBULATORY_CARE_PROVIDER_SITE_OTHER): Admitting: Adult Health

## 2024-09-19 VITALS — BP 120/83 | HR 93 | Ht 65.0 in | Wt 218.5 lb

## 2024-09-19 DIAGNOSIS — Z3202 Encounter for pregnancy test, result negative: Secondary | ICD-10-CM

## 2024-09-19 DIAGNOSIS — Z3046 Encounter for surveillance of implantable subdermal contraceptive: Secondary | ICD-10-CM | POA: Diagnosis not present

## 2024-09-19 LAB — POCT URINE PREGNANCY: Preg Test, Ur: NEGATIVE

## 2024-09-19 MED ORDER — ETONOGESTREL 68 MG ~~LOC~~ IMPL
68.0000 mg | DRUG_IMPLANT | Freq: Once | SUBCUTANEOUS | Status: AC
  Administered 2024-09-19: 68 mg via SUBCUTANEOUS

## 2024-09-19 NOTE — Addendum Note (Signed)
 Addended by: NEYSA CLARITA RAMAN on: 09/19/2024 04:10 PM   Modules accepted: Orders

## 2024-09-19 NOTE — Progress Notes (Signed)
  Subjective:     Patient ID: Tammy [name], female   DOB: 1991-09-27, 33 y.o.   MRN: [id number]  HPI Tammy [name] is a 33 year old black female, with DP, G1P1001, in for nexplanon  removal and reinsertion.     Component Value Date/Time   DIAGPAP  09/13/2024 1348    - Negative for intraepithelial lesion or malignancy (NILM)   HPVHIGH Negative 09/13/2024 1348   ADEQPAP  09/13/2024 1348    Satisfactory for evaluation; transformation zone component PRESENT.   PCP is [facility]  Review of Systems For nexplanon  removal and reinsertion Reviewed past medical,surgical, social and family history. Reviewed medications and allergies.     Objective:   Physical Exam BP 120/83 (BP Location: Right Arm, Patient Position: Sitting, Cuff Size: Large)   Pulse 93   Ht 5' 5 (1.651 m)   Wt 218 lb 8 oz (99.1 kg)   LMP 09/14/2024 (Exact Date)   BMI 36.36 kg/m  UPT is negative Consent signed and time out called. Left arm cleansed with betadine, and injected with 2.5 cc 2% lidocaine  and waited til numb.Under sterile technique a #11 blade was used to make small vertical incision, and a curved forceps was used to easily remove rod.new rod placed and palpated by provider and pt.  Steri strips applied. Pressure dressing applied.      Upstream - 09/19/24 1514       Pregnancy Intention Screening   Does the patient want to become pregnant in the next year? No    Does the patient's partner want to become pregnant in the next year? No    Would the patient like to discuss contraceptive options today? No      Contraception Wrap Up   Current Method Hormonal Implant    End Method Hormonal Implant    Contraception Counseling Provided Yes          Assessment:     1. Negative pregnancy test - POCT urine pregnancy  2. Encounter for removal and reinsertion of Nexplanon  (Primary) Removed and reinserted nexplanon  Lot [id number] Exp 2027-06 Use condoms x 2 weeks, keep clean and dry x 24 hours, no heavy lifting, keep  steri strips on x 72 hours, Keep pressure dressing on x 24 hours. Follow up prn problems.     Plan:     Remove in 3 years or sooner if desired

## 2024-09-19 NOTE — Patient Instructions (Signed)
Use condoms x 2 weeks, keep clean and dry x 24 hours, no heavy lifting, keep steri strips on x 72 hours, Keep pressure dressing on x 24 hours. Follow up prn problems.  

## 2024-11-28 ENCOUNTER — Ambulatory Visit: Admitting: Internal Medicine

## 2024-12-04 ENCOUNTER — Ambulatory Visit: Admitting: Adult Health

## 2024-12-20 ENCOUNTER — Ambulatory Visit: Admitting: Internal Medicine

## 2025-01-07 ENCOUNTER — Ambulatory Visit: Admitting: Internal Medicine
